# Patient Record
Sex: Male | Born: 1970 | Race: White | Hispanic: No | Smoking: Former smoker
Health system: Southern US, Community
[De-identification: ages and names within clinical notes are randomized; demographics above are authoritative.]

## PROBLEM LIST (undated history)

## (undated) DIAGNOSIS — G894 Chronic pain syndrome: Secondary | ICD-10-CM

## (undated) DIAGNOSIS — R519 Headache, unspecified: Secondary | ICD-10-CM

## (undated) DIAGNOSIS — N401 Enlarged prostate with lower urinary tract symptoms: Secondary | ICD-10-CM

## (undated) DIAGNOSIS — R51 Headache: Secondary | ICD-10-CM

## (undated) DIAGNOSIS — N486 Induration penis plastica: Secondary | ICD-10-CM

## (undated) DIAGNOSIS — N138 Other obstructive and reflux uropathy: Secondary | ICD-10-CM

## (undated) DIAGNOSIS — N4889 Other specified disorders of penis: Secondary | ICD-10-CM

## (undated) HISTORY — PX: FRACTURE SURGERY: SHX138

## (undated) HISTORY — DX: Other obstructive and reflux uropathy: N13.8

## (undated) HISTORY — PX: MANDIBLE FRACTURE SURGERY: SHX706

## (undated) HISTORY — DX: Chronic pain syndrome: G89.4

## (undated) HISTORY — PX: EYE SURGERY: SHX253

## (undated) HISTORY — DX: Headache, unspecified: R51.9

## (undated) HISTORY — DX: Induration penis plastica: N48.6

## (undated) HISTORY — DX: Headache: R51

## (undated) HISTORY — DX: Other specified disorders of penis: N48.89

## (undated) HISTORY — DX: Benign prostatic hyperplasia with lower urinary tract symptoms: N40.1

---

## 2002-07-08 ENCOUNTER — Inpatient Hospital Stay (HOSPITAL_COMMUNITY): Admission: EM | Admit: 2002-07-08 | Discharge: 2002-07-11 | Payer: Self-pay | Admitting: Psychiatry

## 2006-07-13 ENCOUNTER — Emergency Department (HOSPITAL_COMMUNITY): Admission: EM | Admit: 2006-07-13 | Discharge: 2006-07-13 | Payer: Self-pay | Admitting: Emergency Medicine

## 2006-11-05 ENCOUNTER — Emergency Department (HOSPITAL_COMMUNITY): Admission: EM | Admit: 2006-11-05 | Discharge: 2006-11-05 | Payer: Self-pay | Admitting: Emergency Medicine

## 2006-11-12 ENCOUNTER — Emergency Department (HOSPITAL_COMMUNITY): Admission: EM | Admit: 2006-11-12 | Discharge: 2006-11-12 | Payer: Self-pay | Admitting: Emergency Medicine

## 2007-02-18 ENCOUNTER — Emergency Department (HOSPITAL_COMMUNITY): Admission: EM | Admit: 2007-02-18 | Discharge: 2007-02-18 | Payer: Self-pay | Admitting: Emergency Medicine

## 2007-03-16 ENCOUNTER — Emergency Department (HOSPITAL_COMMUNITY): Admission: EM | Admit: 2007-03-16 | Discharge: 2007-03-16 | Payer: Self-pay | Admitting: Emergency Medicine

## 2007-03-21 ENCOUNTER — Emergency Department (HOSPITAL_COMMUNITY): Admission: EM | Admit: 2007-03-21 | Discharge: 2007-03-21 | Payer: Self-pay | Admitting: Emergency Medicine

## 2007-03-26 ENCOUNTER — Emergency Department (HOSPITAL_COMMUNITY): Admission: EM | Admit: 2007-03-26 | Discharge: 2007-03-26 | Payer: Self-pay | Admitting: Emergency Medicine

## 2007-07-26 ENCOUNTER — Emergency Department: Payer: Self-pay | Admitting: Unknown Physician Specialty

## 2007-07-31 ENCOUNTER — Ambulatory Visit: Payer: Self-pay | Admitting: Psychiatry

## 2007-07-31 ENCOUNTER — Inpatient Hospital Stay (HOSPITAL_COMMUNITY): Admission: AD | Admit: 2007-07-31 | Discharge: 2007-08-04 | Payer: Self-pay | Admitting: Psychiatry

## 2007-08-02 ENCOUNTER — Emergency Department (HOSPITAL_COMMUNITY): Admission: EM | Admit: 2007-08-02 | Discharge: 2007-08-02 | Payer: Self-pay | Admitting: Emergency Medicine

## 2007-08-10 ENCOUNTER — Emergency Department: Payer: Self-pay | Admitting: Emergency Medicine

## 2007-08-10 ENCOUNTER — Other Ambulatory Visit: Payer: Self-pay

## 2008-10-19 ENCOUNTER — Emergency Department (HOSPITAL_COMMUNITY): Admission: EM | Admit: 2008-10-19 | Discharge: 2008-10-19 | Payer: Self-pay | Admitting: Emergency Medicine

## 2008-10-21 IMAGING — CT CT ABDOMEN W/ CM
1 of 2 series · 14 of 32 positions shown, 18 images · IV contrast (omnipaque)
Comparison: 03/16/2007

CLINICAL DATA: 35-year-old with abdominal pain and blood in stool.  
ABDOMEN CT WITH CONTRAST:
TECHNIQUE: Multidetector CT imaging of the abdomen was performed following the standard protocol during bolus administration of intravenous contrast.
Contrast:  125 cc Omnipaque 300
TECHNIQUE: Multidetector CT imaging of the pelvis was performed following the standard protocol during bolus administration of intravenous contrast.

[Series 2: abd_pel 5.0 b40f st · axial · 0.69mm/px · z∈[-508,-8]mm · 14 of 110 slices shown, 18 images]
[im 5/110  soft-tissue]
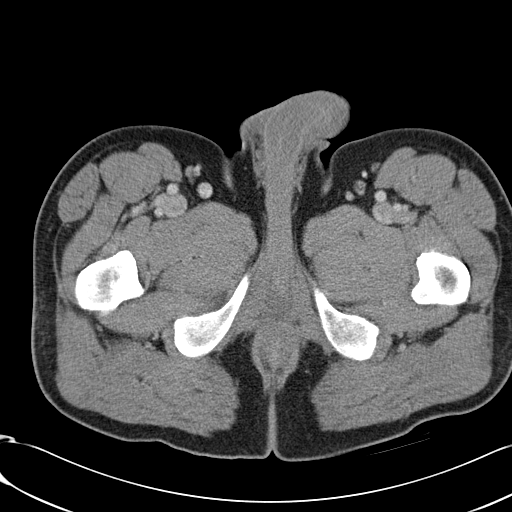
[im 5/110  bone]
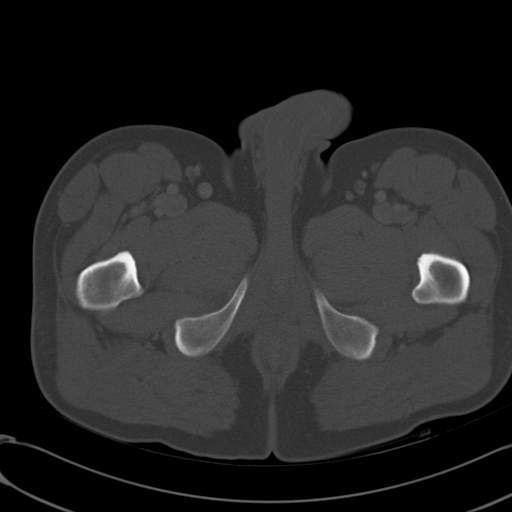
[im 15/110  soft-tissue]
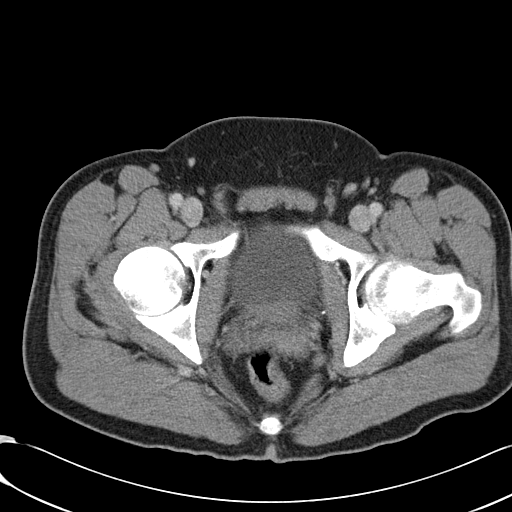
[im 24/110  soft-tissue]
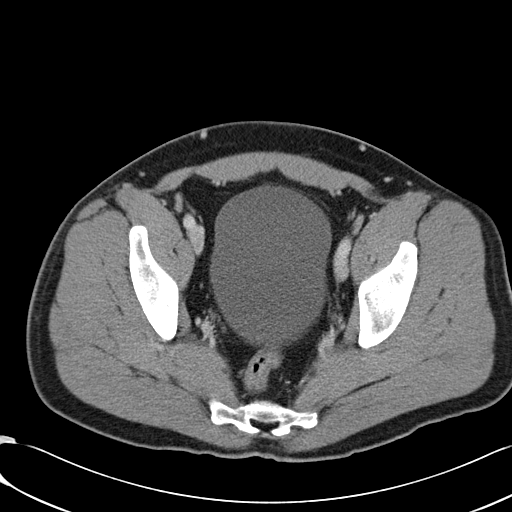
[im 34/110  soft-tissue]
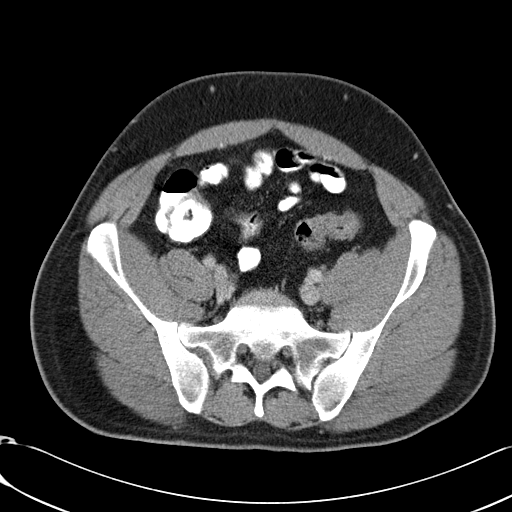
[im 43/110  soft-tissue]
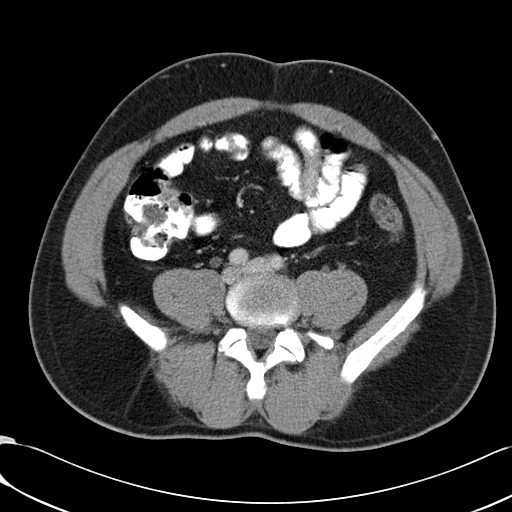
[im 53/110  soft-tissue]
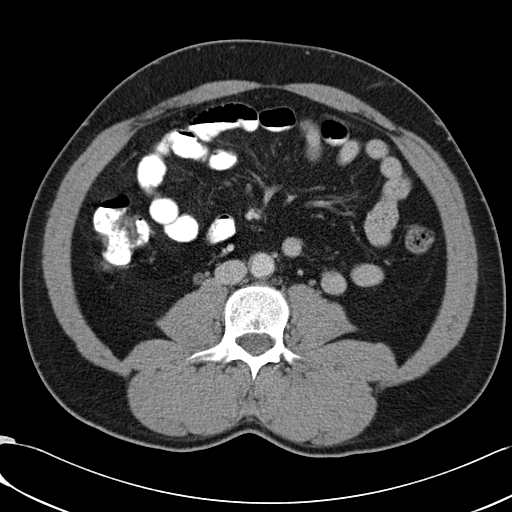
[im 57/110  soft-tissue]
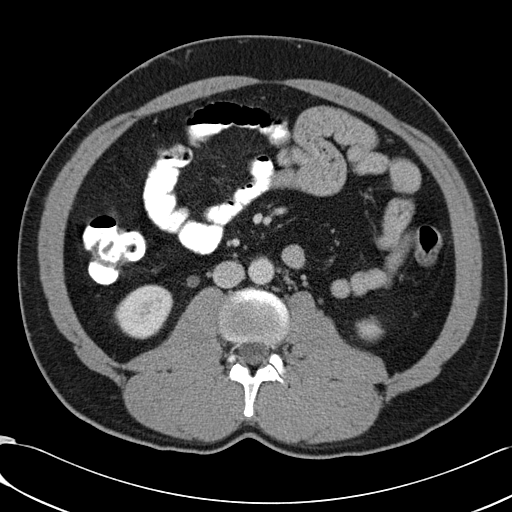
[im 67/110  soft-tissue]
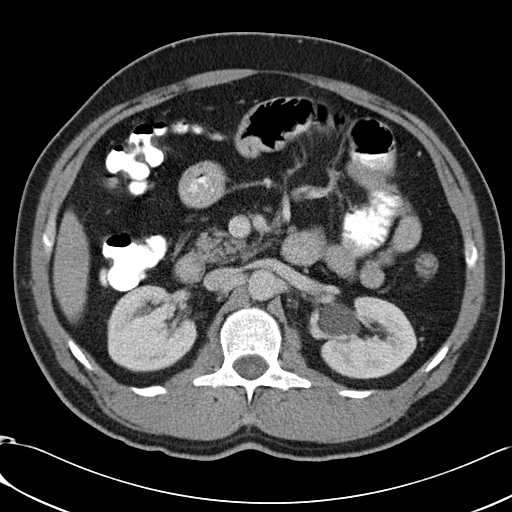
[im 76/110  soft-tissue]
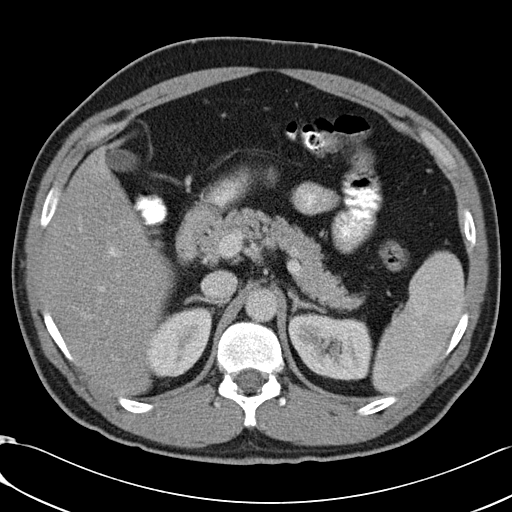
[im 76/110  bone]
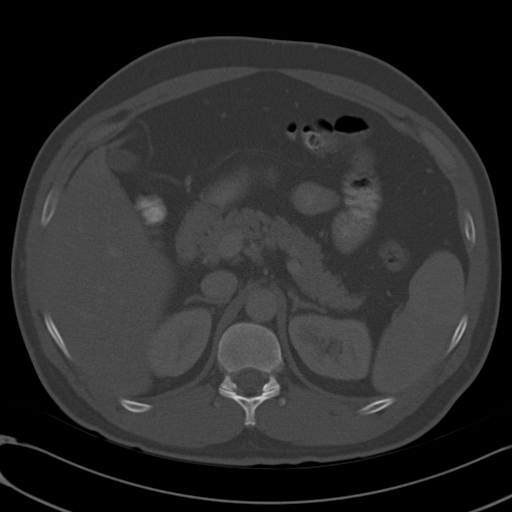
[im 86/110  soft-tissue]
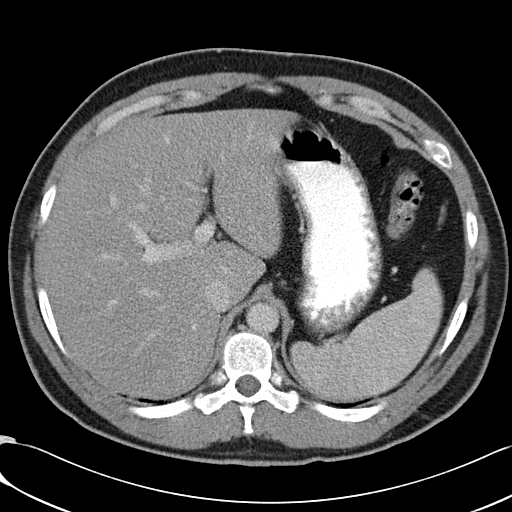
[im 91/110  lung]
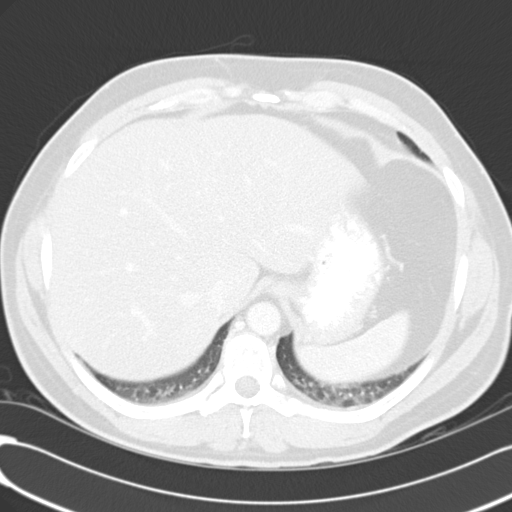
[im 95/110  soft-tissue]
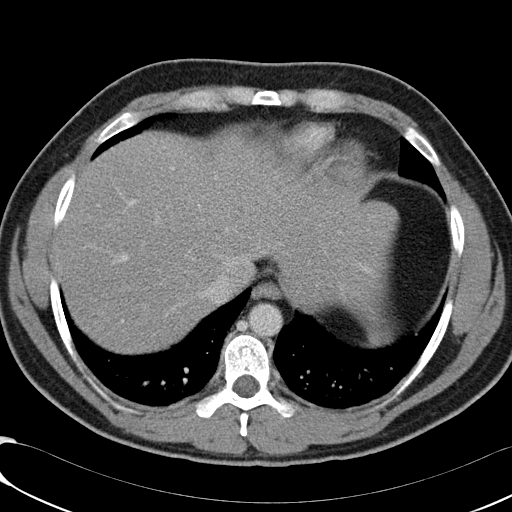
[im 95/110  lung]
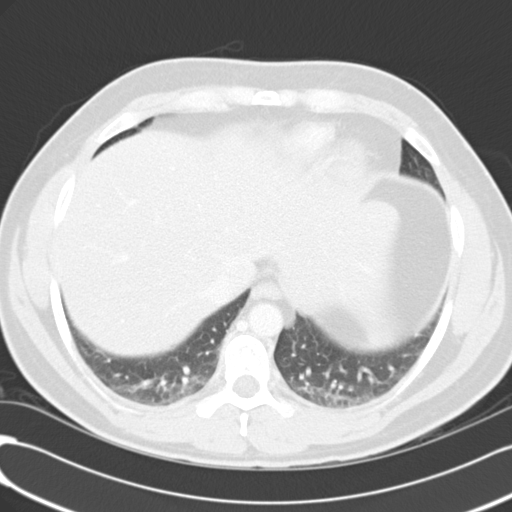
[im 100/110  lung]
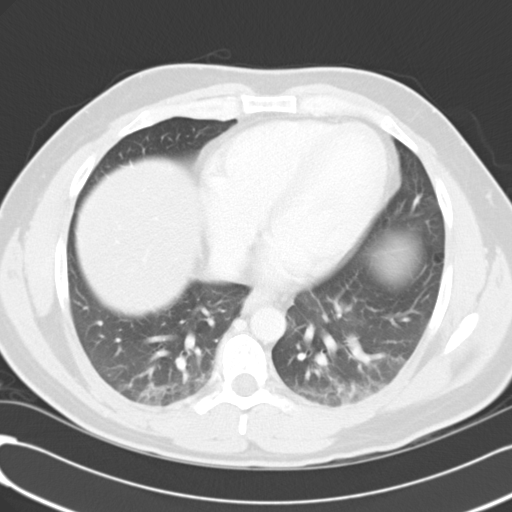
[im 105/110  soft-tissue]
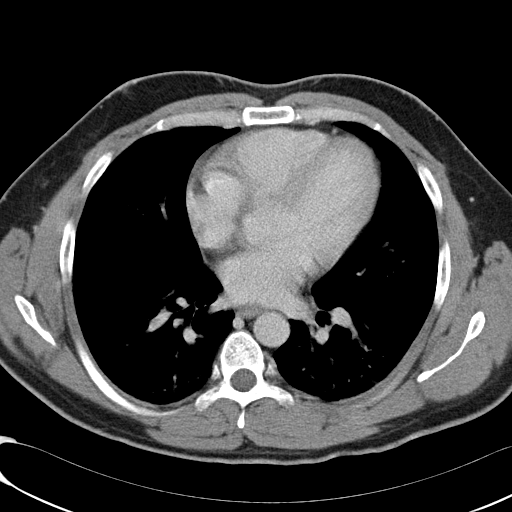
[im 105/110  lung]
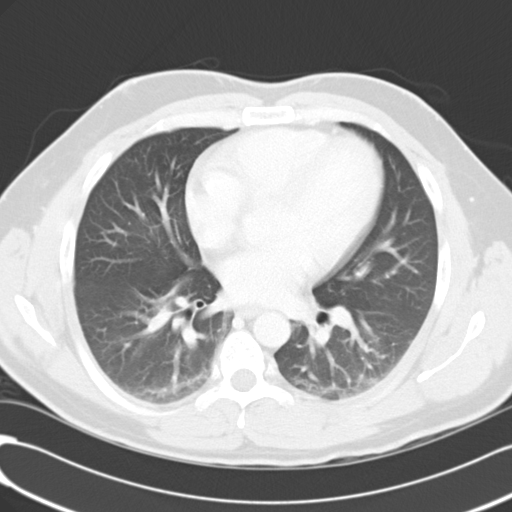

[14 of 32 positions shown; findings below may reference images not displayed]

FINDINGS: Again noted is a pulmonary nodule near the right minor fissure that measures between 5 and 6 mm on sequence 3 image #1.  The patient has bibasilar atelectasis.  No evidence for free air.  Again noted is low density throughout the liver parenchyma suggestive for fat infiltration.  Otherwise, the liver and portal venous system are within normal limits.  Again noted is a small subcutaneous nodule along the lower right anterior chest measuring up to 9 mm and unchanged.  The adrenal tissue, splenic tissue, gallbladder, pancreas and stomach are within normal limits. No significant free fluid or lymphadenopathy in the abdomen.  There is some contraction of the transverse colon which is similar to the previous examination and unclear if this is related to peristalsis.  The appendix opacifies with contrast.  A few dilated loops of proximal small bowel without a clear obstructive process, however, a few of these dilated loops of small bowel also demonstrate wall thickening. The patient has bilateral pars defects at L5.  No significant anterolisthesis.  Again noted are extrarenal pelvises bilaterally without evidence of hydronephrosis.
IMPRESSION: 1.  A few dilated loops of proximal small bowel with a few segments of bowel wall thickening.  There is no evidence for an obstructive process.  In addition, there is some decompression and possible wall thickening involving the transverse colon.  These are subtle abnormalities but based on the history of bloody stools, an inflammatory process such as Crohn?s disease would be in the differential diagnosis.  There is no significant lymphadenopathy or free fluid at this time. 
2.  Fatty liver. 
3.  Stable nodule near the right minor fissure of the lungs.
4.  Bilateral pars defects at L5. 
PELVIS CT WITH CONTRAST:
FINDINGS: No free fluid or lymphadenopathy.  Mild distention of the urinary bladder.  No acute bone abnormalities in the pelvis.
IMPRESSION: Negative CT of the pelvis.

## 2008-11-16 ENCOUNTER — Emergency Department (HOSPITAL_COMMUNITY): Admission: EM | Admit: 2008-11-16 | Discharge: 2008-11-16 | Payer: Self-pay | Admitting: Emergency Medicine

## 2008-12-10 ENCOUNTER — Emergency Department (HOSPITAL_COMMUNITY): Admission: EM | Admit: 2008-12-10 | Discharge: 2008-12-11 | Payer: Self-pay | Admitting: Emergency Medicine

## 2009-11-14 ENCOUNTER — Emergency Department (HOSPITAL_COMMUNITY): Admission: EM | Admit: 2009-11-14 | Discharge: 2009-11-15 | Payer: Self-pay | Admitting: Emergency Medicine

## 2009-12-06 ENCOUNTER — Emergency Department (HOSPITAL_COMMUNITY): Admission: EM | Admit: 2009-12-06 | Discharge: 2009-12-06 | Payer: Self-pay | Admitting: Emergency Medicine

## 2010-01-22 ENCOUNTER — Emergency Department (HOSPITAL_COMMUNITY): Admission: EM | Admit: 2010-01-22 | Discharge: 2010-01-22 | Payer: Self-pay | Admitting: Emergency Medicine

## 2010-04-29 ENCOUNTER — Emergency Department (HOSPITAL_COMMUNITY): Admission: EM | Admit: 2010-04-29 | Discharge: 2010-04-29 | Payer: Self-pay | Admitting: Emergency Medicine

## 2010-05-14 ENCOUNTER — Emergency Department (HOSPITAL_COMMUNITY): Admission: EM | Admit: 2010-05-14 | Discharge: 2010-05-14 | Payer: Self-pay | Admitting: Emergency Medicine

## 2011-02-07 LAB — DIFFERENTIAL
Basophils Relative: 0 % (ref 0–1)
Eosinophils Absolute: 0.1 10*3/uL (ref 0.0–0.7)
Eosinophils Relative: 2 % (ref 0–5)
Lymphocytes Relative: 29 % (ref 12–46)
Lymphs Abs: 1.9 10*3/uL (ref 0.7–4.0)
Neutro Abs: 4.2 10*3/uL (ref 1.7–7.7)
Neutrophils Relative %: 63 % (ref 43–77)

## 2011-02-07 LAB — URINALYSIS, ROUTINE W REFLEX MICROSCOPIC
Glucose, UA: NEGATIVE mg/dL
Ketones, ur: NEGATIVE mg/dL
Protein, ur: NEGATIVE mg/dL
Urobilinogen, UA: 0.2 mg/dL (ref 0.0–1.0)
pH: 7.5 (ref 5.0–8.0)

## 2011-02-07 LAB — LIPASE, BLOOD: Lipase: 24 U/L (ref 11–59)

## 2011-02-07 LAB — COMPREHENSIVE METABOLIC PANEL
AST: 17 U/L (ref 0–37)
Alkaline Phosphatase: 110 U/L (ref 39–117)
BUN: 9 mg/dL (ref 6–23)
CO2: 25 mEq/L (ref 19–32)
Chloride: 106 mEq/L (ref 96–112)
Potassium: 4 mEq/L (ref 3.5–5.1)
Total Protein: 7.6 g/dL (ref 6.0–8.3)

## 2011-02-07 LAB — CBC
MCHC: 33.5 g/dL (ref 30.0–36.0)
RBC: 4.6 MIL/uL (ref 4.22–5.81)
WBC: 6.6 10*3/uL (ref 4.0–10.5)

## 2011-02-08 LAB — CBC
MCHC: 35 g/dL (ref 30.0–36.0)
RDW: 16.4 % — ABNORMAL HIGH (ref 11.5–15.5)

## 2011-02-08 LAB — DIFFERENTIAL
Basophils Relative: 0 % (ref 0–1)
Monocytes Relative: 6 % (ref 3–12)
Neutro Abs: 5.5 10*3/uL (ref 1.7–7.7)
Neutrophils Relative %: 59 % (ref 43–77)

## 2011-02-08 LAB — POCT CARDIAC MARKERS: Myoglobin, poc: 50.4 ng/mL (ref 12–200)

## 2011-02-08 LAB — COMPREHENSIVE METABOLIC PANEL
Albumin: 4.3 g/dL (ref 3.5–5.2)
Alkaline Phosphatase: 96 U/L (ref 39–117)
CO2: 25 mEq/L (ref 19–32)
Chloride: 104 mEq/L (ref 96–112)
Glucose, Bld: 84 mg/dL (ref 70–99)
Potassium: 3.6 mEq/L (ref 3.5–5.1)
Sodium: 138 mEq/L (ref 135–145)
Total Bilirubin: 0.5 mg/dL (ref 0.3–1.2)

## 2011-03-11 NOTE — Discharge Summary (Signed)
Maurice Dillon, Maurice Dillon                 ACCOUNT NO.:  [id number]   MEDICAL RECORD NO.:  [id number]          PATIENT TYPE:  IPS   LOCATION:  0506                          FACILITY:  [facility]   PHYSICIAN:  Geoffery Lyons, M.D.      DATE OF BIRTH:  [birth year]   DATE OF ADMISSION:  07/31/2007  DATE OF DISCHARGE:  08/04/2007                               DISCHARGE SUMMARY   CHIEF COMPLAINT AND PRESENT ILLNESS:  This was the first admission to  [facility] for this 40 year old male involuntarily  committed.  Commitment papers indicated that he wanted to kill himself,  plan of hanging himself, had a rope in the back of his car, using  marijuana and Xanax, crying, recently broke up.   PAST PSYCHIATRIC HISTORY:  [facility].  No outpatient  treatment.  Using marijuana.   MEDICAL HISTORY:  Back surgery 2006, left lower lobe pneumonia.   MEDICATION:  1. Percocet.  2. Xanax.   PHYSICAL EXAM:  Performed and failed to show any acute findings.   LABORATORY WORKUP:  Sodium 137, potassium 4.2, glucose 130, SGOT 40,  SGPT 41, bilirubin 0.3, white blood cells 7.3, hemoglobin 16.2.  Drug  screen positive for benzodiazepines, for cocaine, for opiates and  marijuana.   MENTAL STATUS EXAM:  Reveals alert, cooperative male.  Mood depressed.  Affect depressed.  Thought processes logical, coherent and relevant.  Endorsed that he was heartbroken, has not been eating or sleeping,  tearful through the interview.  Also dealing with a pneumonia.  Suicidal  thoughts, could not contract for safety.  No delusions.  No  hallucinations.  Cognition well-preserved.   ADMITTING DIAGNOSIS:  AXIS I:  Major depressive disorder, marijuana  abuse, cocaine abuse.  AXIS II:  No diagnosis.  AXIS III:  Left lower lobe pneumonia, status post back surgery.  AXIS IV:  Moderate.  AXIS V:  GAF on admission 35, GAF highest in last year 60.   COURSE IN THE HOSPITAL:  He was admitted.  He was started in  individual  and group psychotherapy.  He was given Ambien for sleep.  He was  maintained on the Z-PAK.  He had been prescribed oxycodone.  He was  maintained on some Librium on a p.r.n. basis, monitoring for any  withdrawal from the Xanax and he was given Seroquel.  He endorsed that  he got fed up the day before he was admitted.  Broke up with the  girlfriend three weeks prior to his admission.  They stayed together for  a year and were going to get married and he could not deal with it.  He  tried to talk to her.  She did not want to talk to him.  Very depressed,  decreased sleep, crying, decreased appetite, weight loss 30 pounds.  He  saw a Veterinary surgeon after divorce.  He was given Seroquel and Xanax.  Actually living with his brother.  He works and travels a lot, lots of  stress.  Married once and apparently she cheated on him as he claims.  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  He did admit that he smoked marijuana but  endorsed he has not used cocaine in a long time.  He was concerned that  it was placing the marijuana.  Continued to be pretty upset but he has  decided that he had to move on, willing to let go of the relationship  with the girlfriend, tearful.  Also grieving the loss of the  relationship with her 30-year-old daughter, has been with him for the  last 3 years.  He continued to endorse pain secondary to the pneumonia.  He was in the past using Xanax 2 mg up to four times a day.  He endorsed  worsening of the chest pain.  October 9 he was sent to the ED for an  assessment.  He was evaluated and treated, no new findings.  By October  10 he was better.  Endorsed he was at a point where he had accepted the  fact that the relationship was over.  He was grieving more so of the  fact that he would not be able to see her daughter, her son, that he has  grown to love as his own but he said he is ready to move on.  Plans to  go back to school and get his life back together.  We did some  grief  loss.  We did coping skills, relapse prevention and on October 11 he was  in full contact with reality.  No suicidal or homicidal ideas, no  hallucinations or delusions.  Ready and motivated to pursue further  outpatient treatment.   DISCHARGE DIAGNOSIS:  AXIS I:  Adjustment disorder with depression,  depressive disorder not otherwise specified.  Marijuana abuse.  AXIS II:  No diagnosis.  AXIS III:  Left lower lobe pneumonia and status post back surgery.  AXIS IV:  Moderate.  AXIS V:  GAF upon discharge 55-60.  Discharged on no medications.  To  pursue treatment through [facility].      Geoffery Lyons, M.D.  Electronically Signed     IL/MEDQ  D:  08/31/2007  T:  09/02/2007  Job:  [id number]
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
## 2011-03-11 NOTE — Discharge Summary (Signed)
Maurice Dillon, Maurice Dillon                             ACCOUNT NO.:  [id number]   MEDICAL RECORD NO.:  [id number]                   PATIENT TYPE:  IPS   LOCATION:  0504                                 FACILITY:  [facility]   PHYSICIAN:  Jeanice Lim, M.D.              DATE OF BIRTH:  11-Feb-1971   DATE OF ADMISSION:  07/08/2002  DATE OF DISCHARGE:  07/11/2002                                 DISCHARGE SUMMARY   IDENTIFYING. DATA:  This is a 40 year old separated Caucasian male  voluntarily admitted with a history of depression and laceration to right  hand.  The patient had felt alone, helpless, and missed his son, having  fleeting suicidal thoughts.   MEDICATIONS:  1. Ativan.  2. Lortab for laceration.   DRUG ALLERGIES:  PENICILLIN.   PHYSICAL EXAMINATION:  GENERAL: Essentially within normal limits.  NEUROLOGIC: Nonfocal.   LABORATORY DATA:  Routine admission labs: Urine drug screen was positive for  benzodiazepines.  White count mildly elevated at 13.8.  Otherwise within  normal limits.   MENTAL STATUS EXAM:  Alert, young middle-aged, casually dressed male with  fair eye contact.  Speech was clear.  Mood: Depressed.  Affect: Sad and  tearful.  Thought process: Goal directed.  Thought content: Negative for  psychotic symptoms, flight of ideas, or paranoia.  Cognitive: Completely  intact.  Judgment and insight: Fair.   ADMISSION DIAGNOSES:   AXIS I:  Depressive disorder.   AXIS II:  None.   AXIS III:  Laceration to right hand.   AXIS IV:  Moderate problems with primary support group.   AXIS V:  30/70   HOSPITAL COURSE:  The patient was admitted, ordered routine p.r.n.  medications, underwent further monitoring, and was encouraged to participate  in individual, group, and milieu therapy.  The patient was started on Paxil  targeting depressive symptoms.  Lortab was discontinued and Vicodin was  ordered p.r.n. pain, Seroquel was given p.r.n. agitation, and Paxil  optimized,  targeting depressive symptoms.  The patient tolerated medication  changes well without side effects and responded to clinical intervention.  Trazodone was ordered to restore sleep and Paxil further optimized.  Keflex  was given to decrease risk of infection in laceration.  Aftercare planning  was done.   CONDITION ON DISCHARGE:  Condition on discharge was markedly improved.  Mood  was more euthymic.  Affect: Brighter.  Thought processes: Goal directed.  Thought content: Negative for dangerous ideation or psychotic symptoms.  The  patient reported motivation to be compliant with the aftercare plan.   DISCHARGE MEDICATIONS:  1. Trazodone 100 mg two q.h.s.  2. Paxil CR 37.5 mg q.a.m.  3. Keflex 500 mg b.i.d.  4. Motrin 800 mg q.8h. p.r.n. pain.  5. Klonopin 0.5 mg q.12h. p.r.n. anxiety.  6. Vicodin two q.6.h. p.r.n. pain, given a one week supply.   FOLLOW UP:  1. The patient was  to follow up with her dentist as soon as possible.  2. [facility] on September 29 at 2:30.   DISCHARGE DIAGNOSES:   AXIS I:  Depressive disorder.   AXIS II:  None.   AXIS III:  Laceration to right hand.   AXIS IV:  Moderate problems with primary support group.   AXIS V:  Global assessment of functioning on discharge was 55.                                                 Jeanice Lim, M.D.    JEM/MEDQ  D:  08/15/2002  T:  08/15/2002  Job:  [id number]

## 2011-07-29 LAB — POCT CARDIAC MARKERS
CKMB, poc: <1 ng/mL — ABNORMAL LOW (ref 1.0–8.0)
CKMB, poc: <1 ng/mL — ABNORMAL LOW (ref 1.0–8.0)
Myoglobin, poc: 44.4 ng/mL (ref 12–200)

## 2011-07-29 LAB — CBC
MCHC: 32.5 g/dL (ref 30.0–36.0)
MCV: 90.9 fL (ref 78.0–100.0)
Platelets: 217 10*3/uL (ref 150–400)
RDW: 15.4 % (ref 11.5–15.5)
WBC: 8 10*3/uL (ref 4.0–10.5)

## 2011-07-29 LAB — COMPREHENSIVE METABOLIC PANEL
AST: 21 U/L (ref 0–37)
Albumin: 3.6 g/dL (ref 3.5–5.2)
Calcium: 9.7 mg/dL (ref 8.4–10.5)
Chloride: 106 mEq/L (ref 96–112)
Creatinine, Ser: 0.66 mg/dL (ref 0.4–1.5)
GFR calc Af Amer: >60 mL/min (ref >60)
Total Protein: 7.8 g/dL (ref 6.0–8.3)

## 2011-07-29 LAB — DIFFERENTIAL
Eosinophils Relative: 3 % (ref 0–5)
Lymphocytes Relative: 27 % (ref 12–46)
Lymphs Abs: 2.2 10*3/uL (ref 0.7–4.0)
Monocytes Absolute: 0.4 10*3/uL (ref 0.1–1.0)
Monocytes Relative: 5 % (ref 3–12)

## 2011-07-29 LAB — LIPASE, BLOOD: Lipase: 21 U/L (ref 11–59)

## 2011-07-29 LAB — TROPONIN I: Troponin I: <0.01 ng/mL (ref 0.00–0.06)

## 2011-07-29 LAB — D-DIMER, QUANTITATIVE: D-Dimer, Quant: 2.83 ug/mL-FEU — ABNORMAL HIGH (ref 0.00–0.48)

## 2011-08-04 LAB — POCT CARDIAC MARKERS
CKMB, poc: <1 — ABNORMAL LOW
Myoglobin, poc: 31
Operator id: 4708

## 2011-08-11 LAB — CBC
Hemoglobin: 15.4
MCHC: 34.6
MCV: 93
Platelets: 189
RBC: 4.77
RDW: 13.6
WBC: 9.3
WBC: 9.4

## 2011-08-11 LAB — BASIC METABOLIC PANEL
CO2: 24
Calcium: 9.6
Creatinine, Ser: 1.01
GFR calc Af Amer: >60
GFR calc non Af Amer: >60
Sodium: 142

## 2011-08-11 LAB — DIFFERENTIAL
Basophils Absolute: 0
Eosinophils Absolute: 0.2
Eosinophils Relative: 2
Lymphocytes Relative: 30
Lymphs Abs: 2.8
Lymphs Abs: 3.4 — ABNORMAL HIGH
Monocytes Absolute: 0.9 — ABNORMAL HIGH
Monocytes Relative: 10
Neutro Abs: 5.3
Neutrophils Relative %: 52
Neutrophils Relative %: 58

## 2011-08-11 LAB — OCCULT BLOOD X 1 CARD TO LAB, STOOL: Fecal Occult Bld: POSITIVE

## 2011-10-01 ENCOUNTER — Emergency Department (HOSPITAL_COMMUNITY)
Admission: EM | Admit: 2011-10-01 | Discharge: 2011-10-01 | Discharge status: Against Medical Advice | Payer: Medicaid Other | Attending: Emergency Medicine | Admitting: Emergency Medicine

## 2011-10-01 ENCOUNTER — Emergency Department (HOSPITAL_COMMUNITY): Admission: EM | Admit: 2011-10-01 | Discharge: 2011-10-01 | Payer: Self-pay | Source: Home / Self Care

## 2011-10-01 ENCOUNTER — Encounter: Payer: Self-pay | Admitting: Emergency Medicine

## 2011-10-01 DIAGNOSIS — Z0389 Encounter for observation for other suspected diseases and conditions ruled out: Secondary | ICD-10-CM | POA: Insufficient documentation

## 2011-10-01 NOTE — ED Notes (Signed)
Pt c/o right sided jaw pain x 2 days.  

## 2011-10-20 ENCOUNTER — Emergency Department (HOSPITAL_COMMUNITY)
Admission: EM | Admit: 2011-10-20 | Discharge: 2011-10-20 | Disposition: A | Discharge status: Home / Self Care | Payer: Medicare Other | Attending: Emergency Medicine | Admitting: Emergency Medicine

## 2011-10-20 ENCOUNTER — Encounter (HOSPITAL_COMMUNITY): Payer: Self-pay | Admitting: *Deleted

## 2011-10-20 DIAGNOSIS — K089 Disorder of teeth and supporting structures, unspecified: Secondary | ICD-10-CM | POA: Insufficient documentation

## 2011-10-20 DIAGNOSIS — K0889 Other specified disorders of teeth and supporting structures: Secondary | ICD-10-CM

## 2011-10-20 DIAGNOSIS — R6884 Jaw pain: Secondary | ICD-10-CM | POA: Insufficient documentation

## 2011-10-20 DIAGNOSIS — K029 Dental caries, unspecified: Secondary | ICD-10-CM | POA: Insufficient documentation

## 2011-10-20 MED ORDER — CLINDAMYCIN HCL 150 MG PO CAPS: 300.0000 mg | ORAL_CAPSULE | Freq: Three times a day (TID) | ORAL | Status: AC

## 2011-10-20 MED ORDER — HYDROMORPHONE HCL PF 1 MG/ML IJ SOLN
1.0000 mg | Freq: Once | INTRAMUSCULAR | Status: AC
  Administered 2011-10-20: 1 mg via INTRAMUSCULAR
  Filled 2011-10-20: qty 1
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
## 2011-10-22 NOTE — ED Provider Notes (Signed)
I saw and evaluated the patient, reviewed the resident's note and I agree with the findings and plan. Pt c/o upper tooth pain. Decayed tooth, gum tenderness. No trismus.   Suzi Roots, MD 10/22/11 [phone]

## 2016-02-24 ENCOUNTER — Encounter: Payer: Self-pay | Admitting: *Deleted

## 2016-02-26 ENCOUNTER — Encounter: Payer: Self-pay | Admitting: Urology

## 2016-02-26 ENCOUNTER — Ambulatory Visit: Payer: Self-pay | Admitting: Urology

## 2016-06-11 ENCOUNTER — Inpatient Hospital Stay
Admission: EM | Admit: 2016-06-11 | Discharge: 2016-06-11 | DRG: 897 | Disposition: A | Discharge status: Home / Self Care | Payer: Commercial Managed Care - HMO | Source: Ambulatory Visit | Attending: Psychiatry | Admitting: Psychiatry

## 2016-06-11 DIAGNOSIS — G894 Chronic pain syndrome: Secondary | ICD-10-CM | POA: Diagnosis present

## 2016-06-11 DIAGNOSIS — Z880 Allergy status to penicillin: Secondary | ICD-10-CM | POA: Diagnosis not present

## 2016-06-11 DIAGNOSIS — G4700 Insomnia, unspecified: Secondary | ICD-10-CM | POA: Diagnosis present

## 2016-06-11 DIAGNOSIS — Z9889 Other specified postprocedural states: Secondary | ICD-10-CM

## 2016-06-11 DIAGNOSIS — Z885 Allergy status to narcotic agent status: Secondary | ICD-10-CM

## 2016-06-11 DIAGNOSIS — F332 Major depressive disorder, recurrent severe without psychotic features: Secondary | ICD-10-CM | POA: Diagnosis present

## 2016-06-11 DIAGNOSIS — Z9114 Patient's other noncompliance with medication regimen: Secondary | ICD-10-CM

## 2016-06-11 DIAGNOSIS — Z765 Malingerer [conscious simulation]: Secondary | ICD-10-CM | POA: Diagnosis not present

## 2016-06-11 DIAGNOSIS — Z886 Allergy status to analgesic agent status: Secondary | ICD-10-CM

## 2016-06-11 DIAGNOSIS — F1320 Sedative, hypnotic or anxiolytic dependence, uncomplicated: Secondary | ICD-10-CM | POA: Diagnosis present

## 2016-06-11 DIAGNOSIS — F319 Bipolar disorder, unspecified: Secondary | ICD-10-CM | POA: Diagnosis present

## 2016-06-11 DIAGNOSIS — Z87891 Personal history of nicotine dependence: Secondary | ICD-10-CM

## 2016-06-11 DIAGNOSIS — F1394 Sedative, hypnotic or anxiolytic use, unspecified with sedative, hypnotic or anxiolytic-induced mood disorder: Secondary | ICD-10-CM | POA: Diagnosis not present

## 2016-06-11 DIAGNOSIS — F1324 Sedative, hypnotic or anxiolytic dependence with sedative, hypnotic or anxiolytic-induced mood disorder: Secondary | ICD-10-CM | POA: Diagnosis present

## 2016-06-11 MED ORDER — ALPRAZOLAM 0.5 MG PO TABS
ORAL_TABLET | ORAL | Status: AC
  Filled 2016-06-11: qty 1

## 2016-06-11 MED ORDER — ALPRAZOLAM 0.5 MG PO TABS: 0.5000 mg | ORAL_TABLET | Freq: Three times a day (TID) | ORAL | Status: DC

## 2016-06-11 MED ORDER — ALPRAZOLAM 1 MG PO TABS
1.0000 mg | ORAL_TABLET | Freq: Once | ORAL | Status: AC
  Administered 2016-06-11: 1 mg via ORAL
  Filled 2016-06-11: qty 1

## 2016-06-11 MED ORDER — BUSPIRONE HCL 5 MG PO TABS
15.0000 mg | ORAL_TABLET | Freq: Two times a day (BID) | ORAL | Status: DC
  Administered 2016-06-11: 15 mg via ORAL
  Filled 2016-06-11: qty 3

## 2016-06-11 MED ORDER — OXYCODONE HCL 5 MG PO TABS
10.0000 mg | ORAL_TABLET | Freq: Once | ORAL | Status: AC
  Administered 2016-06-11: 10 mg via ORAL
  Filled 2016-06-11: qty 2

## 2016-06-11 MED ORDER — OXYCODONE HCL ER 10 MG PO T12A: 10.0000 mg | EXTENDED_RELEASE_TABLET | Freq: Two times a day (BID) | ORAL | Status: DC

## 2016-06-11 MED ORDER — OXYCODONE HCL ER 10 MG PO T12A
10.0000 mg | EXTENDED_RELEASE_TABLET | Freq: Two times a day (BID) | ORAL | Status: DC
  Administered 2016-06-11: 10 mg via ORAL
  Filled 2016-06-11: qty 1

## 2016-06-11 MED ORDER — NAPROXEN 250 MG PO TABS
250.0000 mg | ORAL_TABLET | Freq: Two times a day (BID) | ORAL | Status: DC
  Filled 2016-06-11: qty 1

## 2016-06-11 MED ORDER — CHLORDIAZEPOXIDE HCL 25 MG PO CAPS
25.0000 mg | ORAL_CAPSULE | Freq: Three times a day (TID) | ORAL | Status: DC
  Filled 2016-06-11: qty 1

## 2016-06-11 NOTE — [facility] Group Notes (Signed)
[facility] LCSW Group Therapy  06/11/2016 3:30 PM  Type of Therapy:  Group Therapy  Participation Level:  Pt did not attend group. CSW invited pt to group.   Summary of Progress/Problems:Self esteem: Patients discussed self esteem and how it impacts them. They discussed what aspects in their lives has influenced their self esteem. They were challenged to identify changes that are needed in order to improve self esteem. Patients participated in activity where they had to identify positive adjectives they felt described their personality. Patients shared with the group on the following areas: Things I am good at, What I like about my appearance, I've helped others by, What I value the most, compliments I have received, challenges I have overcome, thing that make me unique, and Times I've made others happy.    Maurice Dillon CzechSampson MSW, LCSWA 06/11/2016, 3:30 PM

## 2016-06-11 NOTE — [facility] Counselor (Signed)
  Patient is being discharged today. PSA not required if under 72 hours. Patient is being discharged from unit AMA. Patient is refusing follow up care.   [name] [name] MSW, [facility] ServicesCSWA 06/11/2016 2:35 PM
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
## 2016-06-11 NOTE — Progress Notes (Signed)
D: Patient is a 45 y.o.  year-old male admitted to [facility] ambulatory without difficulty. Patient is alert and oriented upon admission. A: Admission assessment completed without difficulty. Skin and contraband assessment completed wtih Baird Lyonsasey RN with no skin abnormalities nor contraband found. Q.15 minute safety checks were implemented at the time of admission. Patient was oriented to the unit and escorted to room   319A                                                       R: Patient was receptive to and cooperative with admission assessment. Patient contracts for safety on the unit at this time

## 2016-06-11 NOTE — Tx Team (Signed)
Initial Interdisciplinary Treatment Plan   PATIENT STRESSORS: Financial Lonely Relationship Unemployed Chronic pain   PATIENT STRENGTHS: Willingness to improve complient with plan of care and mdications   PROBLEM LIST: Problem List/Patient Goals Date to be addressed Date deferred Reason deferred Estimated date of resolution  anxiety 06/11/16   06/18/16  depression 06/11/16   06/18/16  Self harm 06/11/16   06/18/16  "anxiety controlled" 06/11/16   06/18/16  "depression controlled" 06/11/16   06/18/16                           DISCHARGE CRITERIA:  Ability to meet basic life and health needs Reduction of life-threatening or endangering symptoms to within safe limits Safe-care adequate arrangements made  PRELIMINARY DISCHARGE PLAN: Attend aftercare/continuing care group Return to previous living arrangement  PATIENT/FAMIILY INVOLVEMENT: This treatment plan has been presented to and reviewed with the patient, Maurice Dillon, and/or family member, .  The patient and family have been given the opportunity to ask questions and make suggestions.  Hillery JacksChristine W Khayden Herzberg 06/11/2016, 4:08 AM

## 2016-06-11 NOTE — Progress Notes (Signed)
  [facility] Discharge [facility] :  Will you be returning to the same living situation after discharge:  Yes,  Patient is discharging AMA and is refusing follow up care. At discharge, do you have transportation home?: No. Patient will receive bus pass for [place]. Do you have the ability to pay for your medications: Yes,  Patient has [employer].   Release of information consent forms completed and in the chart;  Patient's signature needed at discharge.  Patient to Follow up at: Patient is refusing follow up care. Patient is being discharged Against medical advice (AMA).   Next level of care provider has access to [facility] Link:no  Safety Planning and Suicide Prevention discussed: Yes,  Patient is aware of crisis line information.   Have you used any form of tobacco in the last 30 days? (Cigarettes, Smokeless Tobacco, Cigars, and/or Pipes): Yes  Has patient been referred to the Quitline?: Patient refused referral  Patient has been referred for addiction treatment: Pt. refused referral  Talyn Eddie G. [name] MSW, LCSWA 06/11/2016, 2:38 PM
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
## 2016-06-11 NOTE — [facility] Suicide Risk Assessment (Signed)
[facility] INPATIENT:  Family/Significant Other Suicide Prevention Education  Suicide Prevention Education:  Patient Refusal for Family/Significant Other Suicide Prevention Education: The patient Maurice Dillon has refused to provide written consent for family/significant other to be provided Family/Significant Other Suicide Prevention Education during admission and/or prior to discharge.  Physician notified. Patient is discharging AMA.   Skyleen Bentley G. [name] MSW, LCSWA 06/11/2016, 2:39 PM

## 2016-06-11 NOTE — [facility] Suicide Risk Assessment (Addendum)
[facility]   Nursing information obtained from:  Patient Demographic factors:  Male, Living alone, Unemployed Current Mental Status:  Suicidal ideation indicated by others Loss Factors:  Financial problems / change in socioeconomic status Historical Factors:  Impulsivity Risk Reduction Factors:  Positive social support  Total Time spent with patient: 30 minutes Principal Problem: Diagnosis:   Patient Active Problem List   Diagnosis Date Noted  . Bipolar 1 disorder (HCC) [F31.9] 06/11/2016  . MDD (major depressive disorder), recurrent episode, severe (HCC) [F33.2] 06/11/2016   Subjective Data:   Continued Clinical Symptoms:  Alcohol Use Disorder Identification Test Final Score (AUDIT): 2 The "Alcohol Use Disorders Identification Test", Guidelines for Use in Primary Care, Second Edition.  World Science writerHealth Organization [facility](WHO). Score between 0-7:  no or low risk or alcohol related problems. Score between 8-15:  moderate risk of alcohol related problems. Score between 16-19:  high risk of alcohol related problems. Score 20 or above:  warrants further diagnostic evaluation for alcohol dependence and treatment.   CLINICAL FACTORS:   Chronic Pain   Musculoskeletal: Strength & Muscle Tone: within normal limits Gait & Station: normal Patient leans: N/A  Psychiatric Specialty Exam: Physical Exam  ROS  Blood pressure 100/75, pulse 70, temperature 98.3 F (36.8 C), temperature source Oral, resp. rate 18.There is no height or weight on file to calculate BMI.  General Appearance: Casual  Eye Contact:  Fair  Speech:  Clear and Coherent  Volume:  Normal  Mood:  Anxious  Affect:  Appropriate  Thought Process:  Coherent  Orientation:  Full (Time, Place, and Person)  Thought Content:  Logical  Suicidal Thoughts:  No  Homicidal Thoughts:  No  Memory:  Immediate;   Fair Recent;   Fair  Judgement:  Fair  Insight:  Fair  Psychomotor Activity:  Normal   Concentration:  Concentration: Fair and Attention Span: Fair  Recall:  [employer]  Fund of Knowledge:  Fair  Language:  Fair  Akathisia:  No  Handed:  Right  AIMS (if indicated):     Assets:  Communication Skills Desire for Improvement Physical Health Talents/Skills  ADL's:  Intact  Cognition:  WNL  Sleep:  Number of Hours: 3      COGNITIVE FEATURES THAT CONTRIBUTE TO RISK:  None    SUICIDE RISK:   Minimal: No identifiable suicidal ideation.  Patients presenting with no risk factors but with morbid ruminations; may be classified as minimal risk based on the severity of the depressive symptoms   PLAN OF CARE: Patient will be discharged AMA  as he does not have any suicidal homicidal ideations or plans at this time.He is seeking drugs including benzodiazepines at this time.  I certify that inpatient services furnished can reasonably be expected to improve the patient's condition.  Brandy HaleUzma Alonte Wulff, MD 06/11/2016, 3:32 PM
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
## 2020-11-23 ENCOUNTER — Encounter (HOSPITAL_COMMUNITY): Payer: Self-pay | Admitting: *Deleted

## 2020-11-23 ENCOUNTER — Inpatient Hospital Stay (HOSPITAL_COMMUNITY)
Admission: EM | Admit: 2020-11-23 | Discharge: 2020-11-24 | DRG: 917 | Disposition: E | Payer: Medicare Other | Attending: Internal Medicine | Admitting: Internal Medicine

## 2020-11-23 ENCOUNTER — Inpatient Hospital Stay (HOSPITAL_COMMUNITY): Payer: Medicare Other

## 2020-11-23 ENCOUNTER — Emergency Department (HOSPITAL_COMMUNITY): Payer: Medicare Other

## 2020-11-23 DIAGNOSIS — J9601 Acute respiratory failure with hypoxia: Secondary | ICD-10-CM | POA: Diagnosis present

## 2020-11-23 DIAGNOSIS — N138 Other obstructive and reflux uropathy: Secondary | ICD-10-CM | POA: Diagnosis present

## 2020-11-23 DIAGNOSIS — E872 Acidosis, unspecified: Secondary | ICD-10-CM

## 2020-11-23 DIAGNOSIS — A419 Sepsis, unspecified organism: Secondary | ICD-10-CM | POA: Diagnosis present

## 2020-11-23 DIAGNOSIS — T68XXXA Hypothermia, initial encounter: Secondary | ICD-10-CM | POA: Diagnosis present

## 2020-11-23 DIAGNOSIS — G894 Chronic pain syndrome: Secondary | ICD-10-CM | POA: Diagnosis present

## 2020-11-23 DIAGNOSIS — G9382 Brain death: Secondary | ICD-10-CM | POA: Diagnosis present

## 2020-11-23 DIAGNOSIS — F1910 Other psychoactive substance abuse, uncomplicated: Secondary | ICD-10-CM | POA: Diagnosis present

## 2020-11-23 DIAGNOSIS — R680 Hypothermia, not associated with low environmental temperature: Secondary | ICD-10-CM | POA: Diagnosis present

## 2020-11-23 DIAGNOSIS — N179 Acute kidney failure, unspecified: Secondary | ICD-10-CM | POA: Diagnosis present

## 2020-11-23 DIAGNOSIS — J690 Pneumonitis due to inhalation of food and vomit: Secondary | ICD-10-CM | POA: Diagnosis present

## 2020-11-23 DIAGNOSIS — R579 Shock, unspecified: Secondary | ICD-10-CM | POA: Diagnosis not present

## 2020-11-23 DIAGNOSIS — T40601A Poisoning by unspecified narcotics, accidental (unintentional), initial encounter: Principal | ICD-10-CM | POA: Diagnosis present

## 2020-11-23 DIAGNOSIS — E874 Mixed disorder of acid-base balance: Secondary | ICD-10-CM | POA: Diagnosis present

## 2020-11-23 DIAGNOSIS — I447 Left bundle-branch block, unspecified: Secondary | ICD-10-CM | POA: Diagnosis present

## 2020-11-23 DIAGNOSIS — Z66 Do not resuscitate: Secondary | ICD-10-CM | POA: Diagnosis present

## 2020-11-23 DIAGNOSIS — Z87891 Personal history of nicotine dependence: Secondary | ICD-10-CM | POA: Diagnosis not present

## 2020-11-23 DIAGNOSIS — Z978 Presence of other specified devices: Secondary | ICD-10-CM | POA: Diagnosis present

## 2020-11-23 DIAGNOSIS — K567 Ileus, unspecified: Secondary | ICD-10-CM | POA: Diagnosis present

## 2020-11-23 DIAGNOSIS — Z880 Allergy status to penicillin: Secondary | ICD-10-CM | POA: Diagnosis not present

## 2020-11-23 DIAGNOSIS — Z885 Allergy status to narcotic agent status: Secondary | ICD-10-CM

## 2020-11-23 DIAGNOSIS — N401 Enlarged prostate with lower urinary tract symptoms: Secondary | ICD-10-CM | POA: Diagnosis present

## 2020-11-23 DIAGNOSIS — G931 Anoxic brain damage, not elsewhere classified: Secondary | ICD-10-CM | POA: Diagnosis present

## 2020-11-23 DIAGNOSIS — I469 Cardiac arrest, cause unspecified: Principal | ICD-10-CM

## 2020-11-23 DIAGNOSIS — Z20822 Contact with and (suspected) exposure to covid-19: Secondary | ICD-10-CM | POA: Diagnosis present

## 2020-11-23 DIAGNOSIS — R140 Abdominal distension (gaseous): Secondary | ICD-10-CM

## 2020-11-23 DIAGNOSIS — E875 Hyperkalemia: Secondary | ICD-10-CM | POA: Diagnosis present

## 2020-11-23 DIAGNOSIS — R6521 Severe sepsis with septic shock: Secondary | ICD-10-CM | POA: Diagnosis present

## 2020-11-23 LAB — I-STAT ARTERIAL BLOOD GAS, ED
Acid-base deficit: 23 mmol/L — ABNORMAL HIGH (ref 0.0–2.0)
Bicarbonate: 11.8 mmol/L — ABNORMAL LOW (ref 20.0–28.0)
Calcium, Ion: 1.11 mmol/L — ABNORMAL LOW (ref 1.15–1.40)
HCT: 39 % (ref 39.0–52.0)
Hemoglobin: 13.3 g/dL (ref 13.0–17.0)
O2 Saturation: 94 %
Potassium: 4.3 mmol/L (ref 3.5–5.1)
Sodium: 139 mmol/L (ref 135–145)
TCO2: 14 mmol/L — ABNORMAL LOW (ref 22–32)
pCO2 arterial: 69.1 mmHg (ref 32.0–48.0)
pH, Arterial: 6.841 — CL (ref 7.350–7.450)
pO2, Arterial: 123 mmHg — ABNORMAL HIGH (ref 83.0–108.0)

## 2020-11-23 LAB — CBC WITH DIFFERENTIAL/PLATELET
Abs Immature Granulocytes: 1.35 10*3/uL — ABNORMAL HIGH (ref 0.00–0.07)
Basophils Absolute: 0.1 10*3/uL (ref 0.0–0.1)
Basophils Relative: 0 %
Eosinophils Absolute: 0.2 10*3/uL (ref 0.0–0.5)
Eosinophils Relative: 1 %
HCT: 44.2 % (ref 39.0–52.0)
Hemoglobin: 12.7 g/dL — ABNORMAL LOW (ref 13.0–17.0)
Immature Granulocytes: 5 %
Lymphocytes Relative: 44 %
Lymphs Abs: 11.4 10*3/uL — ABNORMAL HIGH (ref 0.7–4.0)
MCH: 30.8 pg (ref 26.0–34.0)
MCHC: 28.7 g/dL — ABNORMAL LOW (ref 30.0–36.0)
MCV: 107 fL — ABNORMAL HIGH (ref 80.0–100.0)
Monocytes Absolute: 1.2 10*3/uL — ABNORMAL HIGH (ref 0.1–1.0)
Monocytes Relative: 5 %
Neutro Abs: 11.9 10*3/uL — ABNORMAL HIGH (ref 1.7–7.7)
Neutrophils Relative %: 45 %
Platelets: 140 10*3/uL — ABNORMAL LOW (ref 150–400)
RBC: 4.13 MIL/uL — ABNORMAL LOW (ref 4.22–5.81)
RDW: 13.8 % (ref 11.5–15.5)
WBC: 26.1 10*3/uL — ABNORMAL HIGH (ref 4.0–10.5)
nRBC: 0.2 % (ref 0.0–0.2)

## 2020-11-23 LAB — POCT I-STAT 7, (LYTES, BLD GAS, ICA,H+H)
Acid-base deficit: 7 mmol/L — ABNORMAL HIGH (ref 0.0–2.0)
Acid-base deficit: 11 mmol/L — ABNORMAL HIGH (ref 0.0–2.0)
Bicarbonate: 18.2 mmol/L — ABNORMAL LOW (ref 20.0–28.0)
Bicarbonate: 19.2 mmol/L — ABNORMAL LOW (ref 20.0–28.0)
Calcium, Ion: 0.88 mmol/L — CL (ref 1.15–1.40)
Calcium, Ion: 0.85 mmol/L — CL (ref 1.15–1.40)
HCT: 44 % (ref 39.0–52.0)
HCT: 47 % (ref 39.0–52.0)
Hemoglobin: 15 g/dL (ref 13.0–17.0)
Hemoglobin: 16 g/dL (ref 13.0–17.0)
O2 Saturation: 100 %
O2 Saturation: 89 %
Patient temperature: 32.1
Patient temperature: 33.9
Potassium: 6.1 mmol/L — ABNORMAL HIGH (ref 3.5–5.1)
Potassium: 3.3 mmol/L — ABNORMAL LOW (ref 3.5–5.1)
Sodium: 142 mmol/L (ref 135–145)
Sodium: 144 mmol/L (ref 135–145)
TCO2: 19 mmol/L — ABNORMAL LOW (ref 22–32)
TCO2: 21 mmol/L — ABNORMAL LOW (ref 22–32)
pCO2 arterial: 27.7 mmHg — ABNORMAL LOW (ref 32.0–48.0)
pCO2 arterial: 52.2 mmHg — ABNORMAL HIGH (ref 32.0–48.0)
pH, Arterial: 7.403 (ref 7.350–7.450)
pH, Arterial: 7.155 — CL (ref 7.350–7.450)
pO2, Arterial: 169 mmHg — ABNORMAL HIGH (ref 83.0–108.0)
pO2, Arterial: 62 mmHg — ABNORMAL LOW (ref 83.0–108.0)

## 2020-11-23 LAB — RAPID URINE DRUG SCREEN, HOSP PERFORMED
Amphetamines: POSITIVE — AB
Barbiturates: NOT DETECTED
Benzodiazepines: NOT DETECTED
Cocaine: NOT DETECTED
Opiates: POSITIVE — AB
Tetrahydrocannabinol: POSITIVE — AB

## 2020-11-23 LAB — APTT: aPTT: 66 seconds — ABNORMAL HIGH (ref 24–36)

## 2020-11-23 LAB — GLUCOSE, CAPILLARY
Glucose-Capillary: 241 mg/dL — ABNORMAL HIGH (ref 70–99)
Glucose-Capillary: 231 mg/dL — ABNORMAL HIGH (ref 70–99)
Glucose-Capillary: 275 mg/dL — ABNORMAL HIGH (ref 70–99)
Glucose-Capillary: 215 mg/dL — ABNORMAL HIGH (ref 70–99)
Glucose-Capillary: 69 mg/dL — ABNORMAL LOW (ref 70–99)

## 2020-11-23 LAB — COMPREHENSIVE METABOLIC PANEL
ALT: 237 U/L — ABNORMAL HIGH (ref 0–44)
AST: 431 U/L — ABNORMAL HIGH (ref 15–41)
Albumin: 2.9 g/dL — ABNORMAL LOW (ref 3.5–5.0)
Alkaline Phosphatase: 117 U/L (ref 38–126)
Anion gap: 26 — ABNORMAL HIGH (ref 5–15)
BUN: 16 mg/dL (ref 6–20)
CO2: 9 mmol/L — ABNORMAL LOW (ref 22–32)
Calcium: 8.5 mg/dL — ABNORMAL LOW (ref 8.9–10.3)
Chloride: 105 mmol/L (ref 98–111)
Creatinine, Ser: 1.54 mg/dL — ABNORMAL HIGH (ref 0.61–1.24)
GFR, Estimated: 54 mL/min — ABNORMAL LOW (ref >60)
Glucose, Bld: 295 mg/dL — ABNORMAL HIGH (ref 70–99)
Potassium: 4.6 mmol/L (ref 3.5–5.1)
Sodium: 140 mmol/L (ref 135–145)
Total Bilirubin: 0.7 mg/dL (ref 0.3–1.2)
Total Protein: 5.6 g/dL — ABNORMAL LOW (ref 6.5–8.1)

## 2020-11-23 LAB — URINALYSIS, ROUTINE W REFLEX MICROSCOPIC
Bilirubin Urine: NEGATIVE
Glucose, UA: NEGATIVE mg/dL
Ketones, ur: NEGATIVE mg/dL
Leukocytes,Ua: NEGATIVE
Nitrite: NEGATIVE
Protein, ur: 100 mg/dL — AB
Specific Gravity, Urine: 1.021 (ref 1.005–1.030)
pH: 6 (ref 5.0–8.0)

## 2020-11-23 LAB — HEMOGLOBIN A1C
Hgb A1c MFr Bld: 5.9 % — ABNORMAL HIGH (ref 4.8–5.6)
Mean Plasma Glucose: 122.63 mg/dL

## 2020-11-23 LAB — BASIC METABOLIC PANEL
Anion gap: 23 — ABNORMAL HIGH (ref 5–15)
BUN: 18 mg/dL (ref 6–20)
CO2: 15 mmol/L — ABNORMAL LOW (ref 22–32)
Calcium: 7 mg/dL — ABNORMAL LOW (ref 8.9–10.3)
Chloride: 104 mmol/L (ref 98–111)
Creatinine, Ser: 1.76 mg/dL — ABNORMAL HIGH (ref 0.61–1.24)
GFR, Estimated: 46 mL/min — ABNORMAL LOW (ref >60)
Glucose, Bld: 228 mg/dL — ABNORMAL HIGH (ref 70–99)
Potassium: 6.6 mmol/L (ref 3.5–5.1)
Sodium: 142 mmol/L (ref 135–145)

## 2020-11-23 LAB — TROPONIN I (HIGH SENSITIVITY)
Troponin I (High Sensitivity): 575 ng/L (ref <18)
Troponin I (High Sensitivity): 1278 ng/L (ref <18)

## 2020-11-23 LAB — CBC
HCT: 43.4 % (ref 39.0–52.0)
Hemoglobin: 14.7 g/dL (ref 13.0–17.0)
MCH: 32 pg (ref 26.0–34.0)
MCHC: 33.9 g/dL (ref 30.0–36.0)
MCV: 94.6 fL (ref 80.0–100.0)
Platelets: 128 10*3/uL — ABNORMAL LOW (ref 150–400)
RBC: 4.59 MIL/uL (ref 4.22–5.81)
RDW: 14 % (ref 11.5–15.5)
WBC: 7.6 10*3/uL (ref 4.0–10.5)
nRBC: 0.4 % — ABNORMAL HIGH (ref 0.0–0.2)

## 2020-11-23 LAB — MRSA PCR SCREENING: MRSA by PCR: NEGATIVE

## 2020-11-23 LAB — PROTIME-INR
INR: 1.5 — ABNORMAL HIGH (ref 0.8–1.2)
Prothrombin Time: 17.7 seconds — ABNORMAL HIGH (ref 11.4–15.2)

## 2020-11-23 LAB — HIV ANTIBODY (ROUTINE TESTING W REFLEX): HIV Screen 4th Generation wRfx: NONREACTIVE

## 2020-11-23 LAB — ETHANOL: Alcohol, Ethyl (B): <10 mg/dL (ref <10)

## 2020-11-23 LAB — ACETAMINOPHEN LEVEL: Acetaminophen (Tylenol), Serum: <10 ug/mL — ABNORMAL LOW (ref 10–30)

## 2020-11-23 LAB — CBG MONITORING, ED: Glucose-Capillary: 245 mg/dL — ABNORMAL HIGH (ref 70–99)

## 2020-11-23 LAB — LACTIC ACID, PLASMA: Lactic Acid, Venous: >11 mmol/L (ref 0.5–1.9)

## 2020-11-23 LAB — MAGNESIUM: Magnesium: 2.7 mg/dL — ABNORMAL HIGH (ref 1.7–2.4)

## 2020-11-23 LAB — SARS CORONAVIRUS 2 BY RT PCR (HOSPITAL ORDER, PERFORMED IN ~~LOC~~ HOSPITAL LAB): SARS Coronavirus 2: NEGATIVE

## 2020-11-23 LAB — SALICYLATE LEVEL: Salicylate Lvl: <7 mg/dL — ABNORMAL LOW (ref 7.0–30.0)

## 2020-11-23 MED ORDER — NOREPINEPHRINE 4 MG/250ML-% IV SOLN
0.0000 ug/min | INTRAVENOUS | Status: DC
  Administered 2020-11-23: 50 ug/min via INTRAVENOUS
  Administered 2020-11-23: 5.333 ug/min via INTRAVENOUS
  Administered 2020-11-23 (×2): 50 ug/min via INTRAVENOUS
  Filled 2020-11-23 (×3): qty 250

## 2020-11-23 MED ORDER — FLEET ENEMA 7-19 GM/118ML RE ENEM
1.0000 | ENEMA | Freq: Once | RECTAL | Status: DC
  Filled 2020-11-23: qty 1

## 2020-11-23 MED ORDER — ALBUTEROL SULFATE (2.5 MG/3ML) 0.083% IN NEBU
2.5000 mg | INHALATION_SOLUTION | RESPIRATORY_TRACT | Status: DC
  Administered 2020-11-23 (×4): 2.5 mg via RESPIRATORY_TRACT
  Filled 2020-11-23 (×5): qty 3

## 2020-11-23 MED ORDER — INSULIN ASPART 100 UNIT/ML ~~LOC~~ SOLN
0.0000 [IU] | Freq: Three times a day (TID) | SUBCUTANEOUS | Status: DC
  Administered 2020-11-23: 5 [IU] via SUBCUTANEOUS

## 2020-11-23 MED ORDER — CHLORHEXIDINE GLUCONATE CLOTH 2 % EX PADS
6.0000 | MEDICATED_PAD | Freq: Every day | CUTANEOUS | Status: DC
  Administered 2020-11-23: 6 via TOPICAL

## 2020-11-23 MED ORDER — FAMOTIDINE IN NACL 20-0.9 MG/50ML-% IV SOLN
20.0000 mg | Freq: Two times a day (BID) | INTRAVENOUS | Status: DC
  Administered 2020-11-23: 20 mg via INTRAVENOUS
  Filled 2020-11-23: qty 50

## 2020-11-23 MED ORDER — POLYETHYLENE GLYCOL 3350 17 G PO PACK: 17.0000 g | PACK | Freq: Every day | ORAL | Status: DC | PRN

## 2020-11-23 MED ORDER — SODIUM BICARBONATE 8.4 % IV SOLN
150.0000 meq | Freq: Once | INTRAVENOUS | Status: DC
  Filled 2020-11-23: qty 150

## 2020-11-23 MED ORDER — NOREPINEPHRINE 16 MG/250ML-% IV SOLN: 0.0000 ug/min | INTRAVENOUS | Status: DC

## 2020-11-23 MED ORDER — ASPIRIN 300 MG RE SUPP
300.0000 mg | RECTAL | Status: AC
  Administered 2020-11-23: 300 mg via RECTAL
  Filled 2020-11-23: qty 1

## 2020-11-23 MED ORDER — PHENYLEPHRINE HCL-NACL 10-0.9 MG/250ML-% IV SOLN
0.0000 ug/min | INTRAVENOUS | Status: DC
  Administered 2020-11-23: 20 ug/min via INTRAVENOUS
  Administered 2020-11-23 (×3): 200 ug/min via INTRAVENOUS
  Administered 2020-11-23: 180 ug/min via INTRAVENOUS
  Administered 2020-11-23: 200 ug/min via INTRAVENOUS
  Filled 2020-11-23 (×6): qty 250

## 2020-11-23 MED ORDER — DOCUSATE SODIUM 50 MG/5ML PO LIQD
100.0000 mg | Freq: Two times a day (BID) | ORAL | Status: DC | PRN
  Filled 2020-11-23: qty 10

## 2020-11-23 MED ORDER — NOREPINEPHRINE 4 MG/250ML-% IV SOLN
0.0000 ug/min | INTRAVENOUS | Status: DC
  Administered 2020-11-23: 45 ug/min via INTRAVENOUS
  Administered 2020-11-23: 5 ug/min via INTRAVENOUS
  Filled 2020-11-23 (×2): qty 250

## 2020-11-23 MED ORDER — NOREPINEPHRINE 4 MG/250ML-% IV SOLN
INTRAVENOUS | Status: AC
  Filled 2020-11-23: qty 250

## 2020-11-23 MED ORDER — LACTATED RINGERS IV SOLN: INTRAVENOUS | Status: DC

## 2020-11-23 MED ORDER — ORAL CARE MOUTH RINSE
15.0000 mL | OROMUCOSAL | Status: DC
  Administered 2020-11-23 (×5): 15 mL via OROMUCOSAL

## 2020-11-23 MED ORDER — "THROMBI-PAD 3""X3"" EX PADS"
1.0000 | MEDICATED_PAD | Freq: Once | CUTANEOUS | Status: DC
  Filled 2020-11-23: qty 1

## 2020-11-23 MED ORDER — INSULIN ASPART 100 UNIT/ML ~~LOC~~ SOLN: 0.0000 [IU] | SUBCUTANEOUS | Status: DC

## 2020-11-23 MED ORDER — SODIUM ZIRCONIUM CYCLOSILICATE 10 G PO PACK
10.0000 g | PACK | Freq: Once | ORAL | Status: AC
  Administered 2020-11-23: 10 g
  Filled 2020-11-23: qty 1

## 2020-11-23 MED ORDER — VASOPRESSIN 20 UNITS/100 ML INFUSION FOR SHOCK
0.0000 [IU]/min | INTRAVENOUS | Status: DC
  Administered 2020-11-23: 0.04 [IU]/min via INTRAVENOUS
  Filled 2020-11-23 (×2): qty 100

## 2020-11-23 MED ORDER — SODIUM BICARBONATE 8.4 % IV SOLN
INTRAVENOUS | Status: AC | PRN
  Administered 2020-11-23: 100 meq via INTRAVENOUS

## 2020-11-23 MED ORDER — PHENYLEPHRINE CONCENTRATED 100MG/250ML (0.4 MG/ML) INFUSION SIMPLE
0.0000 ug/min | INTRAVENOUS | Status: DC
  Administered 2020-11-23: 150 ug/min via INTRAVENOUS
  Filled 2020-11-23 (×2): qty 250

## 2020-11-23 MED ORDER — NOREPINEPHRINE 16 MG/250ML-% IV SOLN
0.0000 ug/min | INTRAVENOUS | Status: DC
  Administered 2020-11-23: 50 ug/min via INTRAVENOUS
  Filled 2020-11-23: qty 250

## 2020-11-23 MED ORDER — SODIUM CHLORIDE 0.9 % IV BOLUS
1000.0000 mL | Freq: Once | INTRAVENOUS | Status: AC
  Administered 2020-11-23: 1000 mL via INTRAVENOUS

## 2020-11-23 MED ORDER — CHLORHEXIDINE GLUCONATE 0.12% ORAL RINSE (MEDLINE KIT)
15.0000 mL | Freq: Two times a day (BID) | OROMUCOSAL | Status: DC
  Administered 2020-11-23: 15 mL via OROMUCOSAL

## 2020-11-23 MED ORDER — DOPAMINE-DEXTROSE 3.2-5 MG/ML-% IV SOLN
0.0000 ug/kg/min | INTRAVENOUS | Status: DC
  Administered 2020-11-23: 15 ug/kg/min via INTRAVENOUS
  Administered 2020-11-23: 5 ug/kg/min via INTRAVENOUS
  Administered 2020-11-23: 10 ug/kg/min via INTRAVENOUS
  Filled 2020-11-23 (×2): qty 250

## 2020-11-23 MED ORDER — LACTATED RINGERS IV BOLUS
1000.0000 mL | Freq: Once | INTRAVENOUS | Status: AC
  Administered 2020-11-23: 1000 mL via INTRAVENOUS

## 2020-11-23 MED ORDER — SODIUM BICARBONATE 8.4 % IV SOLN
INTRAVENOUS | Status: DC
  Filled 2020-11-23 (×7): qty 850

## 2020-11-23 MED ORDER — SODIUM BICARBONATE 8.4 % IV SOLN
INTRAVENOUS | Status: AC
  Filled 2020-11-23: qty 150

## 2020-11-23 MED ORDER — HEPARIN SODIUM (PORCINE) 5000 UNIT/ML IJ SOLN
5000.0000 [IU] | Freq: Three times a day (TID) | INTRAMUSCULAR | Status: DC
  Administered 2020-11-23 (×2): 5000 [IU] via SUBCUTANEOUS
  Filled 2020-11-23 (×2): qty 1

## 2020-11-23 MED ORDER — EPINEPHRINE 1 MG/10ML IJ SOSY
PREFILLED_SYRINGE | INTRAMUSCULAR | Status: AC | PRN
  Administered 2020-11-23: 1 via INTRAVENOUS

## 2020-11-24 LAB — URINE CULTURE: Culture: NO GROWTH

## 2020-11-24 LAB — PATHOLOGIST SMEAR REVIEW

## 2020-11-24 NOTE — Death Summary Note (Signed)
DEATH SUMMARY   Patient Details  Name: Maurice Dillon MRN: [id number] DOB: 06/23/1971  Admission/Discharge Information   Admit Date:  11-27-20  Date of Death: Date of Death: November 27, 2020  Time of Death: Time of Death: Feb 11, 1649  Length of Stay: 0  Referring Physician: Patient, No Pcp Per   Reason(s) for Hospitalization  Cardiac arrest  Diagnoses  Preliminary cause of death:  Secondary Diagnoses (including complications and co-morbidities):  Principal Problem:   Cardiac arrest [facility]) Active Problems:   Anoxic encephalopathy (HCC)   Hypothermia   Endotracheally intubated   Shock (HCC)   Polysubstance abuse (HCC)   LBBB (left bundle branch block) Acute renal failure Septic shock secondary to aspiration pneumonia of both lungs Suspected ischemic gut post arrest  Brief Hospital Course (including significant findings, care, treatment, and services provided and events leading to death)  This 50 y.o. Caucasian male presented to the [facility] Emergency Department via EMS after being called out to a motel for an unresponsive patient.  Reported 20 min downtime at the scene.  Known history of drug abuse.  Significant other apparently reported administering 2 doses of Narcan without observed benefit.  EMS gave an additional 2 mg without improvement.  CPR started at 0014 with initial rhythm of asystole; king airway in place; shocked x4 for vfib, Epi x 7 then epi gtt, 450 mg amiodarone, and 500 mL NS.  Prolonged CPR effort that spanned 0014-0140.  The patient had King airway changed to endotracheal tube in the ER (without need for RSI medications).  The patient had another asystolic arrest in the ER, requiring another 24 minutes of CPR, which concluded with continuous transthoracic pacing.  Patient remained comatose with exam as well as CT head c/w brain death. While rewarming for formal declaration he passed away.  His estranged sister was at bedside at time of passing and son is  en route.  Pertinent Labs and Studies  Significant Diagnostic Studies CT ABDOMEN PELVIS WO CONTRAST  Result Date: 27-Nov-2020 CLINICAL DATA:  Overdose.  Abdominal distension. EXAM: CT CHEST, ABDOMEN AND PELVIS WITHOUT CONTRAST TECHNIQUE: Multidetector CT imaging of the chest, abdomen and pelvis was performed following the standard protocol without IV contrast. COMPARISON:  10/19/2008 FINDINGS: CT CHEST FINDINGS Cardiovascular: Heart size is normal. Minimal coronary artery calcification. No aortic atherosclerotic calcification. No pericardial fluid. Mediastinum/Nodes: No mediastinal or hilar mass or lymphadenopathy. Endotracheal tube tip a few cm above the carina. Lungs/Pleura: Likely aspiration with airspace filling in the dependent lungs bilaterally, somewhat worse on the right than the left. No visible effusion. Musculoskeletal: Bilateral anterior nondisplaced rib fractures, likely secondary to CPR. CT ABDOMEN PELVIS FINDINGS Hepatobiliary: No calcified gallstones. No liver parenchymal lesion. Small amount of ascites around the liver. Pancreas: Question mild edema in the region of the pancreatic head and uncinate process that could go along with mild pancreatitis. This is not definite. Spleen: Normal Adrenals/Urinary Tract: Mild prominence of the adrenal gland suggesting adrenal hyperplasia. Both kidneys appear normal. No obstruction. Foley catheter in the bladder. Stomach/Bowel: Stomach full of material. Diffusely dilated small bowel possibly secondary to ileus. Large amount of gas and fecal matter throughout the colon. Vascular/Lymphatic: Aortic atherosclerosis. No aneurysm. IVC is normal. No retroperitoneal adenopathy. Reproductive: Bilateral undescended testes. Other: Small amount of ascites, freely distributed. Musculoskeletal: Ordinary lower lumbar degenerative changes. IMPRESSION: 1. Likely aspiration with airspace filling in the dependent lungs bilaterally, somewhat worse on the right than the left.  2. Nondisplaced bilateral anterior rib fractures, likely secondary  to CPR. 3. Question mild edema in the region of the pancreatic head and uncinate process that could go along with mild pancreatitis. This is not definite. 4. Diffusely dilated small bowel possibly secondary to ileus. Large amount of gas and fecal matter throughout the colon. 5. Small amount of ascites, freely distributed. 6. Bilateral undescended testes. 7. Aortic atherosclerosis. Aortic Atherosclerosis (ICD10-I70.0). Electronically Signed   By: Paulina Fusi M.D.   On: 11/30/20 11:31   CT HEAD WO CONTRAST  Result Date: 2020/11/30 CLINICAL DATA:  Overdose.  Found unresponsive. EXAM: CT HEAD WITHOUT CONTRAST TECHNIQUE: Contiguous axial images were obtained from the base of the skull through the vertex without intravenous contrast. COMPARISON:  Head CT 12/16/2015 FINDINGS: Brain: There is diffuse brain swelling with effacement of the sulci, some loss of gray-white differentiation and pseudo subarachnoid hemorrhage sign, consistent with diffuse hypoxic ischemic brain injury. No evidence of true hemorrhage. No hydrocephalus or extra-axial collection. Vascular: No abnormal vascular finding. Skull: No skull fracture. Sinuses/Orbits: Previous right facial reconstructive surgery. Some chronic mucosal inflammatory changes of the right maxillary sinus. Fluid layering in the sphenoid sinus. Other: None IMPRESSION: Diffuse brain swelling with effacement of the sulci, some loss of gray-white differentiation and pseudo subarachnoid hemorrhage sign, consistent with diffuse hypoxic ischemic brain injury. No evidence of true hemorrhage. Electronically Signed   By: Paulina Fusi M.D.   On: November 30, 2020 11:23   CT CHEST WO CONTRAST  Result Date: November 30, 2020 CLINICAL DATA:  Overdose.  Abdominal distension. EXAM: CT CHEST, ABDOMEN AND PELVIS WITHOUT CONTRAST TECHNIQUE: Multidetector CT imaging of the chest, abdomen and pelvis was performed following the standard  protocol without IV contrast. COMPARISON:  10/19/2008 FINDINGS: CT CHEST FINDINGS Cardiovascular: Heart size is normal. Minimal coronary artery calcification. No aortic atherosclerotic calcification. No pericardial fluid. Mediastinum/Nodes: No mediastinal or hilar mass or lymphadenopathy. Endotracheal tube tip a few cm above the carina. Lungs/Pleura: Likely aspiration with airspace filling in the dependent lungs bilaterally, somewhat worse on the right than the left. No visible effusion. Musculoskeletal: Bilateral anterior nondisplaced rib fractures, likely secondary to CPR. CT ABDOMEN PELVIS FINDINGS Hepatobiliary: No calcified gallstones. No liver parenchymal lesion. Small amount of ascites around the liver. Pancreas: Question mild edema in the region of the pancreatic head and uncinate process that could go along with mild pancreatitis. This is not definite. Spleen: Normal Adrenals/Urinary Tract: Mild prominence of the adrenal gland suggesting adrenal hyperplasia. Both kidneys appear normal. No obstruction. Foley catheter in the bladder. Stomach/Bowel: Stomach full of material. Diffusely dilated small bowel possibly secondary to ileus. Large amount of gas and fecal matter throughout the colon. Vascular/Lymphatic: Aortic atherosclerosis. No aneurysm. IVC is normal. No retroperitoneal adenopathy. Reproductive: Bilateral undescended testes. Other: Small amount of ascites, freely distributed. Musculoskeletal: Ordinary lower lumbar degenerative changes. IMPRESSION: 1. Likely aspiration with airspace filling in the dependent lungs bilaterally, somewhat worse on the right than the left. 2. Nondisplaced bilateral anterior rib fractures, likely secondary to CPR. 3. Question mild edema in the region of the pancreatic head and uncinate process that could go along with mild pancreatitis. This is not definite. 4. Diffusely dilated small bowel possibly secondary to ileus. Large amount of gas and fecal matter throughout the  colon. 5. Small amount of ascites, freely distributed. 6. Bilateral undescended testes. 7. Aortic atherosclerosis. Aortic Atherosclerosis (ICD10-I70.0). Electronically Signed   By: Paulina Fusi M.D.   On: 11-30-2020 11:31   DG Chest Portable 1 View  Result Date: 11-30-20 CLINICAL DATA:  Status post intubation EXAM: PORTABLE  CHEST 1 VIEW COMPARISON:  10/23/2018 FINDINGS: Endotracheal tube is noted at the level of the carina. This should be withdrawn 1-2 cm. Gastric catheter extends into the stomach. Cardiac shadow is within normal limits. The lungs are hypoinflated. Mild left basilar atelectasis is noted. IMPRESSION: Endotracheal tube is at the level of the carina and should be withdrawn 1-2 cm. Left basilar atelectasis is seen. Electronically Signed   By: Alcide Clever M.D.   On: November 26, 2020 02:15   DG Abd Portable 1V  Result Date: 2020/11/26 CLINICAL DATA:  Abdominal distension.  History of cardiac arrest EXAM: PORTABLE ABDOMEN - 1 VIEW COMPARISON:  None recent FINDINGS: Gas and stool distended colon with gas reaching the sigmoid. The proximal colon measures up to 11 cm in diameter. No detected volvulus. Enteric tube with tip seen over the stomach. Left femoral arterial and venous lines. No concerning mass effect or calcification IMPRESSION: Diffuse gas and stool distended colon measuring up to 11 cm in diameter proximally. Favor colonic ileus. Electronically Signed   By: Marnee Spring M.D.   On: 26-Nov-2020 05:58    Microbiology Recent Results (from the past 240 hour(s))  SARS Coronavirus 2 by RT PCR (hospital order, performed in [facility] hospital lab) Nasopharyngeal Nasopharyngeal Swab     Status: None   Collection Time: Nov 26, 2020  1:55 AM   Specimen: Nasopharyngeal Swab  Result Value Ref Range Status   SARS Coronavirus 2 NEGATIVE NEGATIVE Final    Comment: (NOTE) SARS-CoV-2 target nucleic acids are NOT DETECTED.  The SARS-CoV-2 RNA is generally detectable in upper and lower respiratory  specimens during the acute phase of infection. The lowest concentration of SARS-CoV-2 viral copies this assay can detect is 250 copies / mL. A negative result does not preclude SARS-CoV-2 infection and should not be used as the sole basis for treatment or other patient management decisions.  A negative result may occur with improper specimen collection / handling, submission of specimen other than nasopharyngeal swab, presence of viral mutation(s) within the areas targeted by this assay, and inadequate number of viral copies (<250 copies / mL). A negative result must be combined with clinical observations, patient history, and epidemiological information.  Fact Sheet for Patients:   BoilerBrush.com.cy  Fact Sheet for Healthcare Providers: https://pope.com/  This test is not yet approved or  cleared by the [place] FDA and has been authorized for detection and/or diagnosis of SARS-CoV-2 by FDA under an Emergency Use Authorization (EUA).  This EUA will remain in effect (meaning this test can be used) for the duration of the COVID-19 declaration under Section 564(b)(1) of the Act, 21 U.S.C. section 360bbb-3(b)(1), unless the authorization is terminated or revoked sooner.  Performed at [facility], [address] [place], [place] [address]   MRSA PCR Screening     Status: None   Collection Time: November 26, 2020  4:30 AM   Specimen: Nasopharyngeal  Result Value Ref Range Status   MRSA by PCR NEGATIVE NEGATIVE Final    Comment:        The GeneXpert MRSA Assay (FDA approved for NASAL specimens only), is one component of a comprehensive MRSA colonization surveillance program. It is not intended to diagnose MRSA infection nor to guide or monitor treatment for MRSA infections. Performed at [facility], [address] [place], [place] [address]     Lab Basic Metabolic Panel: Recent Labs  Lab November 26, 2020 0200  26-Nov-2020 0208 11/26/20 0430 11/26/2020 0438 11/26/2020 0942  NA 140 139 142  142 144  K 4.6 4.3 6.6* 6.1* 3.3*  CL 105  --  104  --   --   CO2 9*  --  15*  --   --   GLUCOSE 295*  --  228*  --   --   BUN 16  --  18  --   --   CREATININE 1.54*  --  1.76*  --   --   CALCIUM 8.5*  --  7.0*  --   --   MG  --   --  2.7*  --   --    Liver Function Tests: Recent Labs  Lab Dec 06, 2020 0200  AST 431*  ALT 237*  ALKPHOS 117  BILITOT 0.7  PROT 5.6*  ALBUMIN 2.9*   No results for input(s): LIPASE, AMYLASE in the last 168 hours. No results for input(s): AMMONIA in the last 168 hours. CBC: Recent Labs  Lab 12/06/20 0200 2020-12-06 0208 Dec 06, 2020 0430 2020-12-06 0438 12/06/20 0942  WBC 26.1*  --  7.6  --   --   NEUTROABS 11.9*  --   --   --   --   HGB 12.7* 13.3 14.7 15.0 16.0  HCT 44.2 39.0 43.4 44.0 47.0  MCV 107.0*  --  94.6  --   --   PLT 140*  --  128*  --   --    Cardiac Enzymes: No results for input(s): CKTOTAL, CKMB, CKMBINDEX, TROPONINI in the last 168 hours. Sepsis Labs: Recent Labs  Lab Dec 06, 2020 0200 Dec 06, 2020 0430  WBC 26.1* 7.6    Lorin Glass Dec 06, 2020, 5:06 PM

## 2020-11-24 NOTE — Progress Notes (Signed)
Patient passed, vent turned off.  MD, RN and family at bedside.

## 2020-11-24 NOTE — Progress Notes (Signed)
Chaplain responded to death. Family was upset and not ready to receive pastoral care. They said they needed more time. Chaplain told them they can have nurse to page me to return and I will be happy to return.  Mixed emotions-death from overdoes and some family were obviously angry while other very tearful. Difficult emotions for all.    12-05-20 1800  Clinical Encounter Type  Visited With Family  Visit Type Spiritual support  Referral From Nurse  Consult/Referral To Chaplain  Spiritual Encounters  Spiritual Needs Grief support;Other (Comment)  Phebe Colla, Chaplain

## 2020-11-24 NOTE — Progress Notes (Signed)
Sgt Crisco [phone]) with [employer] notified of patients TOD.  All questions answered.  Malva Limes RN

## 2020-11-24 NOTE — H&P (Addendum)
NAME:  Maurice Dillon MRN:  [id number] DOB:  1971/03/09 LOS: 0 ADMISSION DATE:  05-Dec-2020 DATE OF SERVICE:  12-05-2020  CHIEF COMPLAINT:  Cardiac arrest   HISTORY & PHYSICAL  History of Present Illness  This 50 y.o. Caucasian male presented to the [facility] Emergency Department via EMS after being called out to a motel for an unresponsive patient.  Reported 20 min downtime at the scene.  Known history of drug abuse.  Significant other apparently reported administering 2 doses of Narcan without observed benefit.  EMS gave an additional 2 mg without improvement.  CPR started at 0014 with initial rhythm of asystole; king airway in place; shocked x4 for vfib, Epi x 7 then epi gtt, 450 mg amiodarone, and 500 mL NS.  Prolonged CPR effort that spanned 0014-0140.  The patient had King airway changed to endotracheal tube in the ER (without need for RSI medications).  The patient had another asystolic arrest in the ER, requiring another 24 minutes of CPR, which concluded with continuous transthoracic pacing.  REVIEW OF SYSTEMS This patient is critically ill and cannot provide additional history nor review of systems due to mental status/unconsciousness and endotracheally intubated.   Past Medical/Surgical/Social/Family History   Past Medical History:  Diagnosis Date  . BPH with obstruction/lower urinary tract symptoms   . Chronic pain syndrome   . Facial pain   . Nodule of shaft of penis   . Peyronie disease     Past Surgical History:  Procedure Laterality Date  . EYE SURGERY    . FRACTURE SURGERY  Jaws  . MANDIBLE FRACTURE SURGERY     multiple    Social History   Tobacco Use  . Smoking status: Former Games developer  . Smokeless tobacco: Never Used  Substance Use Topics  . Alcohol use: No    Alcohol/week: 0.0 standard drinks    No family history on file.   Procedures:  1/31: endotracheal intubation, transcutaneous pacing   Significant Diagnostic Tests:      Micro Data:  No results found for this or any previous visit.    Antimicrobials:      Interim history/subjective:     Objective   BP (!) 65/44   Pulse 65   Temp (!) 90.5 F (32.5 C)   Resp (!) 24   Ht (S) 5\' 11"  (1.803 m) Comment: measured x 3  Wt 77.1 kg   SpO2 100%   BMI 23.71 kg/m     Filed Weights   12-05-20 0155  Weight: 77.1 kg   No intake or output data in the 24 hours ending 12/05/2020 0238  FiO2 (%):  [100 %] 100 % Set Rate:  [16 bmp] 16 bmp Vt Set:  [600 mL] 600 mL   Examination: GENERAL: Intubated. comatose. No acute distress. HEAD: normocephalic, atraumatic EYE: pupils are fixed, midline and dilated.  No corneal reflex.  No Doll's eyes.  No scleral icterus, no pallor. THROAT/ORAL CAVITY: ETT in situ. NECK: supple, no thyromegaly, no JVD, no lymphadenopathy. Trachea midline. CHEST/LUNG: symmetric in development and expansion. Good air entry. Scattered rales and rhonchi. HEART: Bradycardia. Regular S1 and S2 without murmur, rub or gallop. ABDOMEN: soft, nontender, nondistended. Normoactive bowel sounds. No rebound. No guarding. No hepatosplenomegaly. EXTREMITIES: Edema: none. No cyanosis. No clubbing. 2+ DP pulses LYMPHATIC: no cervical/axillary/inguinal lymph nodes appreciated MUSCULOSKELETAL: No point tenderness. No bulk atrophy. Joints: normal inspection.  SKIN:  No rash or lesion. NEUROLOGIC: No response to noxious stimuli.  No  Doll's eyes. No corneal reflex.  No cough.  No gag.  Synchronous with ventilator.  Babinski absent.  No sensory deficit. DTR: 0+ @ R biceps, 0+ @ L biceps, 0+ @ R patellar,  0+ @ L patellar.    Resolved Hospital Problem list      Assessment & Plan:   ASSESSMENT/PLAN:  ASSESSMENT (included in the Hospital Problem List)  Principal Problem:   Cardiopulmonary arrest [facility]) Active Problems:   Anoxic encephalopathy (HCC)   Shock (HCC)   Hypothermia   Endotracheally intubated   By  systems: PULMONARY Endotracheally intubated  Titrate vent settings based on ABG results.  No sedation to allow neurologic assessment  Albuterol scheduled while intubated   CARDIOVASCULAR Shock, NOS Bradycardia, currently external pacer dependent  CVC placement (emergent conditions)  Start dopamine, given the patient's hypotensive and bradycardic state.   RENAL Severe metabolic and respiratory acidosis  Start bicarbonate infusion  Follow up ABG   GASTROINTESTINAL: No acute issues  GI PROPHYLAXIS: famotidine   HEMATOLOGIC: No acute issues  DVT PROPHYLAXIS: SCDs, heparin  Follow up CBC   INFECTIOUS: No acute issues   ENDOCRINE: No acute issues   NEUROLOGIC Anoxic encephalopathy, clinically suspicious for brain death Hypothermia (exposure) Polysubstance abuse (amphetamine, opiate, THC)  The patient's current hypothermia obviates the need for additional cooling efforts for TTM (36 deg C).  Avoid fever.   PLAN/RECOMMENDATIONS   Admit to ICU under my service (Attending: Marcelle Smiling, MD) with the diagnoses highlighted above in the active Hospital Problem List (ASSESSMENT).  While we will continue to support this patient maximally, he has required prolonged CPR efforts (at least 2 separate bouts, adding up to almost 2 HOURS of resuscitative efforts), which has left him pacemaker dependent at this time.  Clinically, he warrants a brain death evaluation based on his current presentation.  On the other hand, as ROSC was achieved, we will allow the patient to remain hypothermic (in lieu of TTM) prior to rewarming.  In the absence of significant clinical improvement, the patient will likely need brain death evaluation upon rewarming.  In the meantime, I do not believe that it is in the patient's best interest or an acceptable use of limited resources to allow this patient to continue to receive resusctitative care in the event of cardiopulmonary arrest.  It is my opinion  that the patient should have his CODE STATUS changed to DNR.  I will seek input from Dr. Bebe Shaggy (Emergency Medicine).  If he is in agreement, CODE STATUS will be changed to DNR.    My assessment, plan of care, findings, medications, side effects, etc. were discussed with: nurse and Dr. Bebe Shaggy (Emergency Medicine).   Best practice:  Diet: NPO Pain/Anxiety/Delirium protocol (if indicated): NO VAP protocol (if indicated): YES DVT prophylaxis: heparin GI prophylaxis: famotidine Glucose control: N/A Mobility/Activity: Bedrest   Code Status: DNR Family Communication:  no family at bedside.  There is a phone number in the chart for Hassan Buckler ("relative").  I did call this number; however, the phone number is no longer in service. Disposition: admit to ICU   Labs   CBC: Recent Labs  Lab 2020/12/08 0200 12/08/20 0208  WBC 26.1*  --   NEUTROABS PENDING  --   HGB 12.7* 13.3  HCT 44.2 39.0  MCV 107.0*  --   PLT 140*  --     Basic Metabolic Panel: Recent Labs  Lab Dec 08, 2020 0200 2020-12-08 0208  NA 140 139  K 4.6 4.3  CL 105  --  CO2 9*  --   GLUCOSE 295*  --   BUN 16  --   CREATININE 1.54*  --   CALCIUM 8.5*  --    GFR: Estimated Creatinine Clearance: 60.4 mL/min (A) (by C-G formula based on SCr of 1.54 mg/dL (H)). Recent Labs  Lab 2020/12/23 0200  WBC 26.1*    Liver Function Tests: Recent Labs  Lab 12/23/20 0200  AST 431*  ALT PENDING  ALKPHOS 117  BILITOT 0.7  PROT 5.6*  ALBUMIN 2.9*   No results for input(s): LIPASE, AMYLASE in the last 168 hours. No results for input(s): AMMONIA in the last 168 hours.  ABG    Component Value Date/Time   PHART 6.841 (LL) 12/23/2020 0208   PCO2ART 69.1 (HH) 2020/12/23 0208   PO2ART 123 (H) 12-23-2020 0208   HCO3 11.8 (L) 2020/12/23 0208   TCO2 14 (L) 2020-12-23 0208   ACIDBASEDEF 23.0 (H) December 23, 2020 0208   O2SAT 94.0 2020/12/23 0208     Coagulation Profile: No results for input(s): INR, PROTIME in the last 168  hours.  Cardiac Enzymes: No results for input(s): CKTOTAL, CKMB, CKMBINDEX, TROPONINI in the last 168 hours.  HbA1C: No results found for: HGBA1C  CBG: Recent Labs  Lab 23-Dec-2020 0159  GLUCAP 245*     Past Medical History   Past Medical History:  Diagnosis Date  . BPH with obstruction/lower urinary tract symptoms   . Chronic pain syndrome   . Facial pain   . Nodule of shaft of penis   . Peyronie disease       Surgical History    Past Surgical History:  Procedure Laterality Date  . EYE SURGERY    . FRACTURE SURGERY  Jaws  . MANDIBLE FRACTURE SURGERY     multiple      Social History   Social History   Socioeconomic History  . Marital status: Divorced    Spouse name: Not on file  . Number of children: Not on file  . Years of education: Not on file  . Highest education level: Not on file  Occupational History  . Not on file  Tobacco Use  . Smoking status: Former Games developer  . Smokeless tobacco: Never Used  Substance and Sexual Activity  . Alcohol use: No    Alcohol/week: 0.0 standard drinks  . Drug use: No  . Sexual activity: Not on file  Other Topics Concern  . Not on file  Social History Narrative  . Not on file   Social Determinants of Health   Financial Resource Strain: Not on file  Food Insecurity: Not on file  Transportation Needs: Not on file  Physical Activity: Not on file  Stress: Not on file  Social Connections: Not on file      Family History   No family history on file. family history is not on file.    Allergies Allergies  Allergen Reactions  . Penicillins Shortness Of Breath  . Codeine Rash      Current Medications  Current Facility-Administered Medications:  .  DOPamine (INTROPIN) 800 mg in dextrose 5 % 250 mL (3.2 mg/mL) infusion, 0-20 mcg/kg/min, Intravenous, Continuous, Zadie Rhine, MD .  EPINEPHrine (ADRENALIN) 1 MG/10ML injection, , Intravenous, PRN, Zadie Rhine, MD, 1 Syringe at 12/23/2020 0145 .  sodium  bicarbonate injection, , Intravenous, PRN, Marcelle Smiling, MD, 100 mEq at 12/23/2020 0220  Current Outpatient Medications:  .  acetaminophen (TYLENOL) 325 MG tablet, Take 325 mg by mouth every 6 (six) hours as needed.  For pain, Disp: , Rfl:  .  ALPRAZolam (XANAX) 1 MG tablet, Take 1 mg by mouth 3 (three) times daily.  , Disp: , Rfl:  .  busPIRone (BUSPAR) 15 MG tablet, Take 15 mg by mouth 2 (two) times daily.  , Disp: , Rfl:  .  citalopram (CELEXA) 10 MG tablet, Take 10 mg by mouth daily.  , Disp: , Rfl:  .  naproxen sodium (ANAPROX) 220 MG tablet, Take 220 mg by mouth 2 (two) times daily with a meal. For pain , Disp: , Rfl:  .  oxyCODONE (OXYCONTIN) 10 MG 12 hr tablet, Take 10 mg by mouth every 12 (twelve) hours as needed. For pain in hip , Disp: , Rfl:    Home Medications  Prior to Admission medications   Medication Sig Start Date End Date Taking? Authorizing Provider  acetaminophen (TYLENOL) 325 MG tablet Take 325 mg by mouth every 6 (six) hours as needed. For pain    [provider]  ALPRAZolam Prudy Feeler) 1 MG tablet Take 1 mg by mouth 3 (three) times daily.      [provider]  busPIRone (BUSPAR) 15 MG tablet Take 15 mg by mouth 2 (two) times daily.      [provider]  citalopram (CELEXA) 10 MG tablet Take 10 mg by mouth daily.      [provider]  naproxen sodium (ANAPROX) 220 MG tablet Take 220 mg by mouth 2 (two) times daily with a meal. For pain     [provider]  oxyCODONE (OXYCONTIN) 10 MG 12 hr tablet Take 10 mg by mouth every 12 (twelve) hours as needed. For pain in hip     [provider]      Critical care time: 60 minutes.  The treatment and management of the patient's condition was required based on the threat of imminent deterioration. This time reflects time spent by the physician evaluating, providing care and managing the critically ill patient's care. The time was spent at the immediate bedside (or on the same  floor/unit and dedicated to this patient's care). Time involved in separately billable procedures is NOT included int he critical care time indicated above. Family meeting and update time may be included above if and only if the patient is unable/incompetent to participate in clinical interview and/or decision making, and the discussion was necessary to determining treatment decisions.   Marcelle Smiling, MD Board Certified by the ABIM, Pulmonary Diseases & Critical Care Medicine

## 2020-11-24 NOTE — ED Triage Notes (Addendum)
Pt arrived by EMS from a motel for cardiac arrest. Hx of drug use. Significant other gave two doses of narcan, fire gave additional 2mg  without improvement. Estimated 20 min downtime.  CPR started at 0014 with initial rhythm of asystole; king airway in place; shocked x4 for vfib, Epi x 7 then epi gtt, 450mg  amiodarone, and NS. Pupils fixed and dilated IV to L arm, IO to R tibia

## 2020-11-24 NOTE — Progress Notes (Signed)
Pt abd is significantly more distended then on arrival. RN contacted Elink to obtain orders for a KUB.

## 2020-11-24 NOTE — ED Notes (Signed)
3 amps of Sodium Bicarb given IV push enroute to 2H,

## 2020-11-24 NOTE — Progress Notes (Signed)
eLink Physician-Brief Progress Note Patient Name: Maurice Dillon DOB: [birth year] MRN: [id number]   Date of Service  12-22-2020  HPI/Events of Note  ABG on 60%/PRVC 24/TV 600/P 5 = 7.40/27.7/167.  eICU Interventions  Plan: 1. Repeat ABG at 9 AM.     Intervention Category Major Interventions: Respiratory failure - evaluation and management;Acid-Base disturbance - evaluation and management  Manjot Hinks [place] Dec 22, 2020, 5:02 AM

## 2020-11-24 NOTE — Progress Notes (Signed)
Nutrition Brief Note  Chart reviewed. Per MD notes, pt exam without brainstem reflexes but is too unstable for formal brain death testing. MD also suspects dead gut. Pt with terminal decline with no further escalation of care. Per RNCM notes, [place] [employer] has been called to assist in locating family as staff have been unable to reach family members at this time. No further nutrition interventions warranted at this time.  Please re-consult as needed.   Levada Schilling, RD, LDN, CDCES Registered Dietitian II Certified Diabetes Care and Education Specialist Please refer to [facility] for RD and/or RD on-call/weekend/after hours pager

## 2020-11-24 NOTE — ED Provider Notes (Signed)
[facility] EMERGENCY DEPARTMENT Provider Note   CSN: [id number] Arrival date & time: 12-22-2020  0139     History Chief Complaint  Patient presents with  . 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  The history is provided by the EMS personnel. The history is limited by the condition of the patient.  Cardiac Arrest Incident location: motel.  Patient presents via EMS after presumed overdose.  Patient was found unresponsive in a motel, suspected downtime approximately 20 minutes.  Narcan was given without any response.  On EMS arrival patient was in cardiac arrest.  CPR was started at approximately 12:14 AM.  A King airway was placed, patient then went into intraventricular fibrillation and was shocked 4 times.  Patient also received multiple doses of epinephrine, IV fluids as well as amiodarone.  Patient has an IO in his right tibia.  Patient has had copious amount of vomiting.    Past Medical History:  Diagnosis Date  . BPH with obstruction/lower urinary tract symptoms   . Chronic pain syndrome   . Facial pain   . Nodule of shaft of penis   . Peyronie disease     Patient Active Problem List   Diagnosis Date Noted  . Bipolar 1 disorder (HCC) 06/11/2016  . MDD (major depressive disorder), recurrent episode, severe (HCC) 06/11/2016    Past Surgical History:  Procedure Laterality Date  . EYE SURGERY    . FRACTURE SURGERY  Jaws  . MANDIBLE FRACTURE SURGERY     multiple       No family history on file.  Social History   Tobacco Use  . Smoking status: Former Games developer  . Smokeless tobacco: Never Used  Substance Use Topics  . Alcohol use: No    Alcohol/week: 0.0 standard drinks  . Drug use: No    Home Medications Prior to Admission medications   Medication Sig Start Date End Date Taking? Authorizing Provider  acetaminophen (TYLENOL) 325 MG tablet Take 325 mg by mouth every 6 (six) hours as needed. For  pain    [provider]  ALPRAZolam Prudy Feeler) 1 MG tablet Take 1 mg by mouth 3 (three) times daily.      [provider]  busPIRone (BUSPAR) 15 MG tablet Take 15 mg by mouth 2 (two) times daily.      [provider]  citalopram (CELEXA) 10 MG tablet Take 10 mg by mouth daily.      [provider]  naproxen sodium (ANAPROX) 220 MG tablet Take 220 mg by mouth 2 (two) times daily with a meal. For pain     [provider]  oxyCODONE (OXYCONTIN) 10 MG 12 hr tablet Take 10 mg by mouth every 12 (twelve) hours as needed. For pain in hip     [provider]    Allergies    Penicillins and Codeine  Review of Systems   Review of Systems  Unable to perform ROS: Patient unresponsive    Physical Exam Updated Vital Signs BP (!) 177/100   Pulse (!) 54   Temp (!) 94.6 F (34.8 C) (Temporal)   Resp 18   Ht (S) 1.803 m (5\' 11" ) Comment: measured x 3  Wt 77.1 kg   SpO2 99%   BMI 23.71 kg/m   Physical Exam CONSTITUTIONAL: unresponsive HEAD: Normocephalic/atraumatic EYES: pupils fixed/dilated ENMT: king airway in place.  Copious vomit noted in mouth NECK: supple SPINE/BACK:No bruising/crepitance/stepoffs noted  to spine CV: S1/S2 noted, no murmurs/rubs/gallops noted LUNGS: coarse BS noted bilaterally with bagging ABDOMEN: soft  KX:FGHWEX appearance, no visible trauma NEURO: Pt is unresponsive, GCS 3 EXTREMITIES: pulses normal/equal, IO right tibia SKIN: warm PSYCH: Unresponsive  ED Results / Procedures / Treatments   Labs (all labs ordered are listed, but only abnormal results are displayed) Labs Reviewed  COMPREHENSIVE METABOLIC PANEL - Abnormal; Notable for the following components:      Result Value   CO2 9 (*)    Glucose, Bld 295 (*)    Creatinine, Ser 1.54 (*)    Calcium 8.5 (*)    Total Protein 5.6 (*)    Albumin 2.9 (*)    AST 431 (*)    GFR, Estimated 54 (*)    Anion gap 26 (*)    All other components within normal  limits  SALICYLATE LEVEL - Abnormal; Notable for the following components:   Salicylate Lvl <7.0 (*)    All other components within normal limits  ACETAMINOPHEN LEVEL - Abnormal; Notable for the following components:   Acetaminophen (Tylenol), Serum <10 (*)    All other components within normal limits  CBC WITH DIFFERENTIAL/PLATELET - Abnormal; Notable for the following components:   WBC 26.1 (*)    RBC 4.13 (*)    Hemoglobin 12.7 (*)    MCV 107.0 (*)    MCHC 28.7 (*)    Platelets 140 (*)    All other components within normal limits  CBG MONITORING, ED - Abnormal; Notable for the following components:   Glucose-Capillary 245 (*)    All other components within normal limits  I-STAT ARTERIAL BLOOD GAS, ED - Abnormal; Notable for the following components:   pH, Arterial 6.841 (*)    pCO2 arterial 69.1 (*)    pO2, Arterial 123 (*)    Bicarbonate 11.8 (*)    TCO2 14 (*)    Acid-base deficit 23.0 (*)    Calcium, Ion 1.11 (*)    All other components within normal limits  SARS CORONAVIRUS 2 BY RT PCR (HOSPITAL ORDER, PERFORMED IN [facility])  URINE CULTURE  ETHANOL  RAPID URINE DRUG SCREEN, HOSP PERFORMED  BLOOD GAS, ARTERIAL  URINALYSIS, ROUTINE W REFLEX MICROSCOPIC    EKG EKG Interpretation  Date/Time:  Monday 12-05-2020 01:56:07 EST Ventricular Rate:  42 PR Interval:    QRS Duration: 146 QT Interval:  511 QTC Calculation: 428 R Axis:   66 Text Interpretation: Ectopic atrial bradycardia Nonspecific intraventricular conduction delay Probable anterolateral infarct, old Borderline ST depression, anterior leads Confirmed by Zadie Rhine ([id number]) on 12/05/2020 2:05:49 AM   Radiology DG Chest Portable 1 View  Result Date: 05-Dec-2020 CLINICAL DATA:  Status post intubation EXAM: PORTABLE CHEST 1 VIEW COMPARISON:  10/23/2018 FINDINGS: Endotracheal tube is noted at the level of the carina. This should be withdrawn 1-2 cm. Gastric catheter extends into the  stomach. Cardiac shadow is within normal limits. The lungs are hypoinflated. Mild left basilar atelectasis is noted. IMPRESSION: Endotracheal tube is at the level of the carina and should be withdrawn 1-2 cm. Left basilar atelectasis is seen. Electronically Signed   By: Alcide Clever M.D.   On: 2020/12/05 02:15    Procedures .Critical Care Performed by: Zadie Rhine, MD Authorized by: Zadie Rhine, MD   Critical care provider statement:    Critical care time (minutes):  35   Critical care start time:  12-05-2020 1:50 AM   Critical care end time:  2020-12-05  2:25 AM   Critical care time was exclusive of:  Separately billable procedures and treating other patients   Critical care was necessary to treat or prevent imminent or life-threatening deterioration of the following conditions:  Respiratory failure, shock and CNS failure or compromise   Critical care was time spent personally by me on the following activities:  Examination of patient, discussions with consultants, evaluation of patient's response to treatment, pulse oximetry, ordering and review of radiographic studies, ordering and review of laboratory studies, re-evaluation of patient's condition and ordering and performing treatments and interventions   I assumed direction of critical care for this patient from another provider in my specialty: no     Care discussed with: admitting provider   Procedure Name: Intubation Date/Time: 12/11/20 2:00 AM Performed by: Zadie Rhine, MD Pre-anesthesia Checklist: Suction available and Patient being monitored Preoxygenation: Pre-oxygenation with 100% oxygen Laryngoscope Size: Glidescope Grade View: Grade I Tube size: 8.0 mm Number of attempts: 1 Airway Equipment and Method: Video-laryngoscopy Placement Confirmation: ETT inserted through vocal cords under direct vision,  Breath sounds checked- equal and bilateral and CO2 detector Secured at: 25 cm Tube secured with: ETT  holder Comments: Large amount of vomit noted in mouth and surrounding vocal cords.  I suctioned copious amounts of vomit prior to intubation      CPR Procedure Note I PERSONALLY DIRECTED ANCILLARY STAFF OR/PERFORMED CPR IN AN EFFORT TO REGAIN RETURN OF SPONTANEOUS CIRCULATION IN AN EFFORT TO MAINTAIN NEURO, CARDIAC AND SYSTEMIC PERFUSION  Medications Ordered in ED Medications  EPINEPHrine (ADRENALIN) 1 MG/10ML injection (1 Syringe Intravenous Given 12-11-20 0145)  sodium bicarbonate injection (100 mEq Intravenous Given December 11, 2020 0220)  lactated ringers bolus 1,000 mL (1,000 mLs Intravenous New Bag/Given 12-11-20 0216)    ED Course  I have reviewed the triage vital signs and the nursing notes.  Pertinent labs & imaging results that were available during my care of the patient were reviewed by me and considered in my medical decision making (see chart for details).    MDM Rules/Calculators/A&P                          2:18 AM Patient seen on arrival after cardiac arrest in the field.  Patient found unresponsive in a motel with presumed overdose because there was drug paraphernalia around the patient.  He did not respond to Narcan.  EMS reports patient was pulseless on arrival and CPR was started.  Patient had extensive amount of intervention in the prehospital setting.  On arrival patient was being paced with an appropriate pulse.  Pacemaker was stopped and patient continued to have a pulse.  Soon after intubating the patient he went back into cardiac arrest, CPR was started and after 1 epinephrine his pulses returned. Patient is critically ill.  I discussed with the intensivist for admission 2:30 AM D/w Dr. Ardeth Perfect Patient has a very poor prognosis and is critically ill. I have a high suspicion that patient is brain dead. After discussion with Dr. Ardeth Perfect with critical care, we have agreed to make the patient DNR. Final Clinical Impression(s) / ED Diagnoses Final diagnoses:  Cardiac arrest  (HCC)  Acute respiratory failure with hypoxia (HCC)  Metabolic acidosis    Rx / DC Orders ED Discharge Orders    None       Zadie Rhine, MD 12/11/2020 0236

## 2020-11-24 NOTE — Progress Notes (Signed)
Checked in with RN Lorin Picket who said to continue to hold off on EEG. He feels EEG can be canceled and is checking w dr. Katrinka Blazing - will let us know if anything changes

## 2020-11-24 NOTE — Progress Notes (Signed)
EEG on hold. Pt going to CT. Will follow up with MD or nursing if still warrented

## 2020-11-24 NOTE — Progress Notes (Signed)
Patient post 90+ minutes CPR. Cannot oxygenate. Bps dropping. In renal failure. Suspect dead gut as well. Exam without brainstem reflexes but too unstable for formal brain death testing Patient in terminal decline, will attempt to find family. No further escalation of care.  Additional 38 minutes cc time  Myrla Halsted MD PCCM

## 2020-11-24 NOTE — Progress Notes (Signed)
Patient transported on vent to CT and returned to 2H21 without complications.

## 2020-11-24 NOTE — Progress Notes (Addendum)
Attempted to call family/emergency contact.  Numbers are disconnected.  Attempted to call work number- unable to locate an employee.  Attempted home number unable to leave a message  Attempted cell-unable to leave a message  Malva Limes RN

## 2020-11-24 NOTE — Progress Notes (Signed)
eLink Physician-Brief Progress Note Patient Name: KHALEE MAZO DOB: [birth year] MRN: [id number]   Date of Service  12-06-2020  HPI/Events of Note  Multiple issues: 1. Hyperkalemia - K+ = 6.1 and 2. Colonic ileus - abdominal film revels diffuse gas and stool distended colon measuring up to 11 cm in diameter proximally. Favor colonic ileus.  eICU Interventions  Plan: 1. Lokelma 10 gm per tube now. 2. Repeat BMP at 12 noon.  3. Fleets enema now.     Intervention Category Major Interventions: Electrolyte abnormality - evaluation and management  Elizabeth Haff Eugene 12-06-2020, 6:15 AM

## 2020-11-24 NOTE — Procedures (Signed)
Arterial Catheter Insertion Procedure Note  BYRAN BILOTTI  [id number]  Jun 09, 1971  Date:December 15, 2020  Time:4:17 AM    Provider Performing: Darcella Gasman Jonothan Heberle    Procedure: Insertion of Arterial Line (44514) with US guidance (60479)   Indication(s) Blood pressure monitoring and/or need for frequent ABGs  Consent Unable to obtain consent due to emergent nature of procedure.  Anesthesia None   Time Out Verified patient identification, verified procedure, site/side was marked, verified correct patient position, special equipment/implants available, medications/allergies/relevant history reviewed, required imaging and test results available.   Sterile Technique Maximal sterile technique including full sterile barrier drape, hand hygiene, sterile gown, sterile gloves, mask, hair covering, sterile ultrasound probe cover (if used).   Procedure Description Area of catheter insertion was cleaned with chlorhexidine and draped in sterile fashion. With real-time ultrasound guidance an arterial catheter was placed into the left femoral artery.  Appropriate arterial tracings confirmed on monitor.     Complications/Tolerance None; patient tolerated the procedure well.   EBL Minimal   Specimen(s) None  Darcella Gasman Sal Spratley, PA-C

## 2020-11-24 NOTE — Plan of Care (Signed)
  Problem: Coping: Goal: Level of anxiety will decrease Outcome: Progressing   Problem: Elimination: Goal: Will not experience complications related to urinary retention Outcome: Progressing   Problem: Pain Managment: Goal: General experience of comfort will improve Outcome: Progressing   Problem: Safety: Goal: Ability to remain free from injury will improve Outcome: Progressing   Problem: Skin Integrity: Goal: Risk for impaired skin integrity will decrease Outcome: Progressing   Problem: Education: Goal: Knowledge of General Education information will improve Description: Including pain rating scale, medication(s)/side effects and non-pharmacologic comfort measures Outcome: Not Progressing   Problem: Health Behavior/Discharge Planning: Goal: Ability to manage health-related needs will improve Outcome: Not Progressing   Problem: Clinical Measurements: Goal: Ability to maintain clinical measurements within normal limits will improve Outcome: Not Progressing Goal: Will remain free from infection Outcome: Not Progressing Goal: Diagnostic test results will improve Outcome: Not Progressing Goal: Respiratory complications will improve Outcome: Not Progressing Goal: Cardiovascular complication will be avoided Outcome: Not Progressing   Problem: Activity: Goal: Risk for activity intolerance will decrease Outcome: Not Progressing   Problem: Nutrition: Goal: Adequate nutrition will be maintained Outcome: Not Progressing   Problem: Elimination: Goal: Will not experience complications related to bowel motility Outcome: Not Progressing

## 2020-11-24 NOTE — ED Notes (Signed)
CDS called, [phone] referral

## 2020-11-24 NOTE — ED Notes (Signed)
CCM at bedside 

## 2020-11-24 NOTE — Procedures (Signed)
Central Venous Catheter Insertion Procedure Note  Maurice Dillon  [id number]  1971/02/15  Date:11-25-20  Time:4:16 AM   Provider Performing:Kennard Fildes R Barnet Benavides   Procedure: Insertion of Non-tunneled Central Venous Catheter(36556) with US guidance (65465)   Indication(s) Medication administration  Consent Unable to obtain consent due to emergent nature of procedure.  Anesthesia Topical only with 1% lidocaine   Timeout Verified patient identification, verified procedure, site/side was marked, verified correct patient position, special equipment/implants available, medications/allergies/relevant history reviewed, required imaging and test results available.  Sterile Technique Maximal sterile technique including full sterile barrier drape, hand hygiene, sterile gown, sterile gloves, mask, hair covering, sterile ultrasound probe cover (if used).  Procedure Description Area of catheter insertion was cleaned with chlorhexidine and draped in sterile fashion.  With real-time ultrasound guidance a central venous catheter was placed into the left femoral vein. Nonpulsatile blood flow and easy flushing noted in all ports.  The catheter was sutured in place and sterile dressing applied.  Complications/Tolerance None; patient tolerated the procedure well. Chest X-ray is ordered to verify placement for internal jugular or subclavian cannulation.   Chest x-ray is not ordered for femoral cannulation.  EBL Minimal  Specimen(s) None   Darcella Gasman Jaken Fregia, PA-C

## 2020-11-24 NOTE — Progress Notes (Signed)
eLink Physician-Brief Progress Note Patient Name: Maurice Dillon DOB: 30-Mar-1971 MRN: [id number]   Date of Service  11/24/20  HPI/Events of Note  Abdominal distention.   eICU Interventions  Plan: 1. Portable abdominal film STAT.      Intervention Category Major Interventions: Other:  Lenell Antu 2020-11-24, 5:45 AM

## 2020-11-24 NOTE — Progress Notes (Addendum)
NCM made attempt to contact family per numbers listed in epic on face sheet. Calls unsuccessful.  Surfed previous admits and was unable to land contact information. NCM called [place] police department [phone]) spoke with Fredric Mare..... Sheriff to do an attempt to locate check and f/u with NCM results.... TOC team will continue to monitor. Gae Gallop RN,BSN,CM [phone]  2020-12-09 1300 NCM f/u with [place] police department  concerning above matter. Bailey informed NCM Sheriff went to address listed on face sheet and no one was @ residence. [place] police states has a different address in their records. Unable to give NCM address, however, in route to address and will f/u with NCM.  12/09/20 @1336  [place] police called NCM and stated they were unable to land a family or friend @ the address given to them. States at that address said to call [facility]...they might be able to assist.  Dec 09, 2020 @1515  Pt with multi hospital visits @ [facility].    NCM called [facility] medical records for  pt's contact information. Information received : Levasy ([phone]. NCM called  # received and left a voice message...[facility] Awaiting call back  12-09-20 @ 1545 NCM received call from pt's brother Wes. NCM allowed Wes to speak with pt 's nurse. Wes communicated pt has a son in [place] and he will try to contact him. Pt's location text to Wes to give to son per NCM.

## 2020-11-24 NOTE — Progress Notes (Signed)
   11-Dec-2020 0100  Clinical Encounter Type  Visited With Patient not available  Visit Type Trauma  Referral From Nurse  Consult/Referral To Chaplain  The chaplain responded to page concerning a post trauma CPR. The patient is being assessed by medical care team. No family is present. No chaplain services needed at this time. The chaplain will respond as needed.

## 2020-11-24 DEATH — deceased
